# Patient Record
Sex: Female | Born: 1945
Health system: Southern US, Community
[De-identification: ages and names within clinical notes are randomized; demographics above are authoritative.]

## PROBLEM LIST (undated history)

## (undated) DIAGNOSIS — D72820 Lymphocytosis (symptomatic): Secondary | ICD-10-CM

## (undated) DIAGNOSIS — M6740 Ganglion, unspecified site: Secondary | ICD-10-CM

## (undated) DIAGNOSIS — C801 Malignant (primary) neoplasm, unspecified: Secondary | ICD-10-CM

## (undated) DIAGNOSIS — N390 Urinary tract infection, site not specified: Secondary | ICD-10-CM

## (undated) HISTORY — DX: Urinary tract infection, site not specified: N39.0

## (undated) HISTORY — PX: NO PAST SURGERIES: SHX2092

## (undated) HISTORY — DX: Lymphocytosis (symptomatic): D72.820

---

## 2011-11-06 DIAGNOSIS — M2240 Chondromalacia patellae, unspecified knee: Secondary | ICD-10-CM | POA: Diagnosis not present

## 2012-01-26 DIAGNOSIS — Z124 Encounter for screening for malignant neoplasm of cervix: Secondary | ICD-10-CM | POA: Diagnosis not present

## 2012-01-26 DIAGNOSIS — Z9189 Other specified personal risk factors, not elsewhere classified: Secondary | ICD-10-CM | POA: Diagnosis not present

## 2012-01-26 DIAGNOSIS — R87619 Unspecified abnormal cytological findings in specimens from cervix uteri: Secondary | ICD-10-CM | POA: Diagnosis not present

## 2012-01-26 DIAGNOSIS — Z1231 Encounter for screening mammogram for malignant neoplasm of breast: Secondary | ICD-10-CM | POA: Diagnosis not present

## 2012-02-09 DIAGNOSIS — H2510 Age-related nuclear cataract, unspecified eye: Secondary | ICD-10-CM | POA: Diagnosis not present

## 2012-04-21 DIAGNOSIS — L570 Actinic keratosis: Secondary | ICD-10-CM | POA: Diagnosis not present

## 2012-04-21 DIAGNOSIS — L819 Disorder of pigmentation, unspecified: Secondary | ICD-10-CM | POA: Diagnosis not present

## 2012-04-21 DIAGNOSIS — L219 Seborrheic dermatitis, unspecified: Secondary | ICD-10-CM | POA: Diagnosis not present

## 2012-04-21 DIAGNOSIS — L820 Inflamed seborrheic keratosis: Secondary | ICD-10-CM | POA: Diagnosis not present

## 2012-04-28 DIAGNOSIS — M159 Polyosteoarthritis, unspecified: Secondary | ICD-10-CM | POA: Diagnosis not present

## 2012-04-28 DIAGNOSIS — M6740 Ganglion, unspecified site: Secondary | ICD-10-CM | POA: Diagnosis not present

## 2013-01-31 DIAGNOSIS — Z1212 Encounter for screening for malignant neoplasm of rectum: Secondary | ICD-10-CM | POA: Diagnosis not present

## 2013-01-31 DIAGNOSIS — E049 Nontoxic goiter, unspecified: Secondary | ICD-10-CM | POA: Diagnosis not present

## 2013-01-31 DIAGNOSIS — Z124 Encounter for screening for malignant neoplasm of cervix: Secondary | ICD-10-CM | POA: Diagnosis not present

## 2013-01-31 DIAGNOSIS — Z1231 Encounter for screening mammogram for malignant neoplasm of breast: Secondary | ICD-10-CM | POA: Diagnosis not present

## 2013-02-06 DIAGNOSIS — H43399 Other vitreous opacities, unspecified eye: Secondary | ICD-10-CM | POA: Diagnosis not present

## 2013-02-07 DIAGNOSIS — H43819 Vitreous degeneration, unspecified eye: Secondary | ICD-10-CM | POA: Diagnosis not present

## 2013-03-04 DIAGNOSIS — H43819 Vitreous degeneration, unspecified eye: Secondary | ICD-10-CM | POA: Diagnosis not present

## 2013-03-23 DIAGNOSIS — R350 Frequency of micturition: Secondary | ICD-10-CM | POA: Diagnosis not present

## 2013-03-23 DIAGNOSIS — N390 Urinary tract infection, site not specified: Secondary | ICD-10-CM | POA: Diagnosis not present

## 2013-04-21 DIAGNOSIS — N390 Urinary tract infection, site not specified: Secondary | ICD-10-CM | POA: Diagnosis not present

## 2013-04-28 DIAGNOSIS — N390 Urinary tract infection, site not specified: Secondary | ICD-10-CM | POA: Diagnosis not present

## 2013-04-28 DIAGNOSIS — R350 Frequency of micturition: Secondary | ICD-10-CM | POA: Diagnosis not present

## 2013-11-02 DIAGNOSIS — D239 Other benign neoplasm of skin, unspecified: Secondary | ICD-10-CM | POA: Diagnosis not present

## 2013-11-02 DIAGNOSIS — D2370 Other benign neoplasm of skin of unspecified lower limb, including hip: Secondary | ICD-10-CM | POA: Diagnosis not present

## 2013-11-02 DIAGNOSIS — D1801 Hemangioma of skin and subcutaneous tissue: Secondary | ICD-10-CM | POA: Diagnosis not present

## 2013-11-02 DIAGNOSIS — L819 Disorder of pigmentation, unspecified: Secondary | ICD-10-CM | POA: Diagnosis not present

## 2013-11-02 DIAGNOSIS — L821 Other seborrheic keratosis: Secondary | ICD-10-CM | POA: Diagnosis not present

## 2013-11-25 DIAGNOSIS — K5730 Diverticulosis of large intestine without perforation or abscess without bleeding: Secondary | ICD-10-CM | POA: Diagnosis not present

## 2013-11-25 DIAGNOSIS — Z1211 Encounter for screening for malignant neoplasm of colon: Secondary | ICD-10-CM | POA: Diagnosis not present

## 2013-11-25 DIAGNOSIS — D126 Benign neoplasm of colon, unspecified: Secondary | ICD-10-CM | POA: Diagnosis not present

## 2013-12-28 DIAGNOSIS — N390 Urinary tract infection, site not specified: Secondary | ICD-10-CM | POA: Diagnosis not present

## 2013-12-28 DIAGNOSIS — R350 Frequency of micturition: Secondary | ICD-10-CM | POA: Diagnosis not present

## 2014-01-03 DIAGNOSIS — H43819 Vitreous degeneration, unspecified eye: Secondary | ICD-10-CM | POA: Diagnosis not present

## 2014-01-03 DIAGNOSIS — H2589 Other age-related cataract: Secondary | ICD-10-CM | POA: Diagnosis not present

## 2014-02-01 DIAGNOSIS — Z124 Encounter for screening for malignant neoplasm of cervix: Secondary | ICD-10-CM | POA: Diagnosis not present

## 2014-02-01 DIAGNOSIS — Z1331 Encounter for screening for depression: Secondary | ICD-10-CM | POA: Diagnosis not present

## 2014-02-01 DIAGNOSIS — R87610 Atypical squamous cells of undetermined significance on cytologic smear of cervix (ASC-US): Secondary | ICD-10-CM | POA: Diagnosis not present

## 2014-03-21 ENCOUNTER — Other Ambulatory Visit: Payer: Self-pay | Admitting: Internal Medicine

## 2014-03-21 ENCOUNTER — Ambulatory Visit
Admission: RE | Admit: 2014-03-21 | Discharge: 2014-03-21 | Disposition: A | Discharge status: Home / Self Care | Payer: Medicare Other | Source: Ambulatory Visit | Attending: Internal Medicine | Admitting: Internal Medicine

## 2014-03-21 DIAGNOSIS — R059 Cough, unspecified: Secondary | ICD-10-CM

## 2014-03-21 DIAGNOSIS — R05 Cough: Secondary | ICD-10-CM

## 2014-04-11 DIAGNOSIS — H43813 Vitreous degeneration, bilateral: Secondary | ICD-10-CM | POA: Diagnosis not present

## 2015-01-04 DIAGNOSIS — D2239 Melanocytic nevi of other parts of face: Secondary | ICD-10-CM | POA: Diagnosis not present

## 2015-01-04 DIAGNOSIS — L819 Disorder of pigmentation, unspecified: Secondary | ICD-10-CM | POA: Diagnosis not present

## 2015-01-04 DIAGNOSIS — D225 Melanocytic nevi of trunk: Secondary | ICD-10-CM | POA: Diagnosis not present

## 2015-01-04 DIAGNOSIS — D2271 Melanocytic nevi of right lower limb, including hip: Secondary | ICD-10-CM | POA: Diagnosis not present

## 2015-01-04 DIAGNOSIS — L570 Actinic keratosis: Secondary | ICD-10-CM | POA: Diagnosis not present

## 2015-01-04 DIAGNOSIS — L814 Other melanin hyperpigmentation: Secondary | ICD-10-CM | POA: Diagnosis not present

## 2015-01-04 DIAGNOSIS — L821 Other seborrheic keratosis: Secondary | ICD-10-CM | POA: Diagnosis not present

## 2015-01-04 DIAGNOSIS — D1801 Hemangioma of skin and subcutaneous tissue: Secondary | ICD-10-CM | POA: Diagnosis not present

## 2015-02-27 DIAGNOSIS — Z01419 Encounter for gynecological examination (general) (routine) without abnormal findings: Secondary | ICD-10-CM | POA: Diagnosis not present

## 2015-02-27 DIAGNOSIS — Z0000 Encounter for general adult medical examination without abnormal findings: Secondary | ICD-10-CM | POA: Diagnosis not present

## 2015-02-27 DIAGNOSIS — Z13220 Encounter for screening for lipoid disorders: Secondary | ICD-10-CM | POA: Diagnosis not present

## 2015-02-27 DIAGNOSIS — R319 Hematuria, unspecified: Secondary | ICD-10-CM | POA: Diagnosis not present

## 2015-02-27 DIAGNOSIS — Z130 Encounter for screening for diseases of the blood and blood-forming organs and certain disorders involving the immune mechanism: Secondary | ICD-10-CM | POA: Diagnosis not present

## 2015-02-27 DIAGNOSIS — Z1231 Encounter for screening mammogram for malignant neoplasm of breast: Secondary | ICD-10-CM | POA: Diagnosis not present

## 2015-02-27 DIAGNOSIS — Z6820 Body mass index (BMI) 20.0-20.9, adult: Secondary | ICD-10-CM | POA: Diagnosis not present

## 2015-02-27 DIAGNOSIS — Z124 Encounter for screening for malignant neoplasm of cervix: Secondary | ICD-10-CM | POA: Diagnosis not present

## 2015-02-27 DIAGNOSIS — Z13228 Encounter for screening for other metabolic disorders: Secondary | ICD-10-CM | POA: Diagnosis not present

## 2015-02-27 DIAGNOSIS — Z1329 Encounter for screening for other suspected endocrine disorder: Secondary | ICD-10-CM | POA: Diagnosis not present

## 2015-03-08 ENCOUNTER — Telehealth: Payer: Self-pay | Admitting: Oncology

## 2015-03-08 NOTE — Telephone Encounter (Signed)
New patient appt-s/w patient and gave np appt for 09/29 @ 10:30 w/Dr. Alen Blew.  Referring Dr. Paula Compton Dx- Disorder of WBC   Referral information scanned

## 2015-03-14 ENCOUNTER — Encounter: Payer: Self-pay | Admitting: *Deleted

## 2015-03-15 ENCOUNTER — Encounter: Payer: Self-pay | Admitting: Oncology

## 2015-03-15 ENCOUNTER — Other Ambulatory Visit (HOSPITAL_COMMUNITY)
Admission: RE | Admit: 2015-03-15 | Discharge: 2015-03-15 | Disposition: A | Discharge status: Home / Self Care | Payer: Medicare Other | Source: Ambulatory Visit | Attending: Oncology | Admitting: Oncology

## 2015-03-15 ENCOUNTER — Ambulatory Visit (HOSPITAL_BASED_OUTPATIENT_CLINIC_OR_DEPARTMENT_OTHER): Payer: Medicare Other

## 2015-03-15 ENCOUNTER — Telehealth: Payer: Self-pay | Admitting: Oncology

## 2015-03-15 ENCOUNTER — Ambulatory Visit (HOSPITAL_BASED_OUTPATIENT_CLINIC_OR_DEPARTMENT_OTHER): Payer: Medicare Other | Admitting: Oncology

## 2015-03-15 VITALS — BP 169/81 | HR 78 | Temp 98.1°F | Resp 18 | Ht 61.0 in | Wt 109.4 lb

## 2015-03-15 DIAGNOSIS — D72820 Lymphocytosis (symptomatic): Secondary | ICD-10-CM

## 2015-03-15 DIAGNOSIS — Z87440 Personal history of urinary (tract) infections: Secondary | ICD-10-CM

## 2015-03-15 DIAGNOSIS — D7289 Other specified disorders of white blood cells: Secondary | ICD-10-CM | POA: Diagnosis not present

## 2015-03-15 LAB — CBC WITH DIFFERENTIAL/PLATELET
BASO%: 0.2 % (ref 0.0–2.0)
Basophils Absolute: 0 10*3/uL (ref 0.0–0.1)
EOS%: 0.1 % (ref 0.0–7.0)
Eosinophils Absolute: 0 10*3/uL (ref 0.0–0.5)
HEMATOCRIT: 43.3 % (ref 34.8–46.6)
HEMOGLOBIN: 14.3 g/dL (ref 11.6–15.9)
LYMPH#: 16 10*3/uL — AB (ref 0.9–3.3)
LYMPH%: 82.1 % — ABNORMAL HIGH (ref 14.0–49.7)
MCH: 30 pg (ref 25.1–34.0)
MCHC: 33 g/dL (ref 31.5–36.0)
MCV: 90.9 fL (ref 79.5–101.0)
MONO#: 0.5 10*3/uL (ref 0.1–0.9)
MONO%: 2.4 % (ref 0.0–14.0)
NEUT%: 15.2 % — ABNORMAL LOW (ref 38.4–76.8)
NEUTROS ABS: 3 10*3/uL (ref 1.5–6.5)
Platelets: 282 10*3/uL (ref 145–400)
RBC: 4.77 10*6/uL (ref 3.70–5.45)
RDW: 13 % (ref 11.2–14.5)
WBC: 19.5 10*3/uL — AB (ref 3.9–10.3)

## 2015-03-15 LAB — TECHNOLOGIST REVIEW

## 2015-03-15 NOTE — Progress Notes (Signed)
Please see consult note.  

## 2015-03-15 NOTE — Consult Note (Signed)
Reason for Referral:  Lymphocytosis.  HPI:  69 year old woman currently of [place] for the last 2 years. She have lived in multiple locations and originally from [place]. Most recently she establish care with gynecology and had a CBC routinely done and noted that to have increase in her white cell count up to 22,000. She had lymphocytosis with 81% lymphocytes but no other hematological abnormalities. Her hemoglobin is normal and platelet counts are normal. Her white cell count differential was otherwise normal. Except for recurrent urinary tract infections for the last 2 years she has had no other symptoms. She does not recall ever been told that her white cell count being elevated. She denied any fevers or chills or lymphadenopathy. Has not reported any petechiae. Has not reported any other infections hospitalizations. She is a very active and healthy woman and have not had any decline in her health.   She does not report any headaches, blurry vision, syncope or seizures. She does not report any chills, sweats, weight loss or any other constitutional symptoms. She does not report any chest pain, palpitation, orthopnea or leg edema. She does not report any shortness of breath, hemoptysis or wheezing. She does not report any nausea, vomiting, abdominal pain, early satiety. She does not report any frequency, urgency or hematuria. She does report occasional dysuria. She does not report any skeletal complaints. Remaining review of systems unremarkable.    Past Medical History  Diagnosis Date  . UTI (lower urinary tract infection)   . Lymphocytosis   :  No past surgical history on file.:  No current outpatient prescriptions on file.:  No Known Allergies:  No family history on file.:  Social History   Social History  . Marital Status: Married    Spouse Name: N/A  . Number of Children: N/A  . Years of Education: N/A   Occupational History  . Not on file.   Social History Main  Topics  . Smoking status: Not on file  . Smokeless tobacco: Not on file  . Alcohol Use: Not on file  . Drug Use: Not on file  . Sexual Activity: Not on file   Other Topics Concern  . Not on file   Social History Narrative  . No narrative on file  :  Pertinent items are noted in HPI.  Exam: ECOG 0 Blood pressure 169/81, pulse 78, temperature 98.1 F (36.7 C), temperature source Oral, resp. rate 18, height 5\' 1"  (1.549 m), weight 109 lb 6.4 oz (49.624 kg), SpO2 100 %. General appearance: alert and cooperative healthy-appearing woman without distress. Head: Normocephalic, without obvious abnormality Throat: lips, mucosa, and tongue normal; teeth and gums normal no oral ulcers or lesions. Neck: no adenopathy Back: negative Resp: clear to auscultation bilaterally Chest wall: no tenderness Cardio: regular rate and rhythm, S1, S2 normal, no murmur, click, rub or gallop GI: soft, non-tender; bowel sounds normal; no masses,  no organomegaly could not appreciate any splenomegaly. Extremities: extremities normal, atraumatic, no cyanosis or edema Pulses: 2+ and symmetric Skin: Skin color, texture, turgor normal. No rashes or lesions Lymph nodes: Cervical, supraclavicular, and axillary nodes normal.   Assessment and Plan:     69 year old woman with the following issues:  1. Lymphocytosis: The differential diagnosis was discussed with the patient and her husband. Lymphoproliferative disorder as high on the list and the most common condition is chronic lymphocytic leukemia (CLL ). Other lymphoproliferative disorders such as mantle cell lymphoma, hairy cell leukemia , and other lymphomas are possibilities. Less  likely. Reactive findings are also a possibility at this time.   To investigate this further I will repeat her CBC today and obtain peripheral blood flow cytometry for confirmation purposes. If we are dealing with CLL which is the most likely possibility , no indication for treatment  is needed at this time.   The natural course of this disease was discussed with the patient today. Most likely we are dealing with an indolent nature of this disease and no indication for treatment  is noted. Indication for treatment include symptomatic lymphadenopathy, cytopenias constitutional symptoms. He has not of these at this time and will likely not need any treatment in the immediate future.   2. Recurrent urinary tract infections: I do not think this is related to her lymphoproliferative disorder but it certainly possible. I am obtaining quantitative immunoglobulins to check her immune function. She might need a urology evaluation of this becomes and continues to be an issue.   3. Follow-up: Will be in 3 months for surveillance purposes sooner and for flow cytometry show other abnormalities.

## 2015-03-15 NOTE — Telephone Encounter (Signed)
per pof to sch pt appt-gave pt copy of avs °

## 2015-03-19 LAB — SPEP & IFE WITH QIG
ALBUMIN ELP: 4.4 g/dL (ref 3.8–4.8)
ALPHA-2-GLOBULIN: 0.7 g/dL (ref 0.5–0.9)
Alpha-1-Globulin: 0.3 g/dL (ref 0.2–0.3)
BETA GLOBULIN: 0.3 g/dL — AB (ref 0.4–0.6)
Beta 2: 0.3 g/dL (ref 0.2–0.5)
Gamma Globulin: 0.8 g/dL (ref 0.8–1.7)
IgA: 171 mg/dL (ref 69–380)
IgG (Immunoglobin G), Serum: 896 mg/dL (ref 690–1700)
IgM, Serum: 122 mg/dL (ref 52–322)
Total Protein, Serum Electrophoresis: 6.7 g/dL (ref 6.1–8.1)

## 2015-03-20 LAB — FLOW CYTOMETRY

## 2015-03-23 DIAGNOSIS — C9110 Chronic lymphocytic leukemia of B-cell type not having achieved remission: Secondary | ICD-10-CM | POA: Diagnosis not present

## 2015-03-23 DIAGNOSIS — Z0000 Encounter for general adult medical examination without abnormal findings: Secondary | ICD-10-CM | POA: Diagnosis not present

## 2015-03-23 DIAGNOSIS — Z23 Encounter for immunization: Secondary | ICD-10-CM | POA: Diagnosis not present

## 2015-03-23 DIAGNOSIS — Z1389 Encounter for screening for other disorder: Secondary | ICD-10-CM | POA: Diagnosis not present

## 2015-03-23 DIAGNOSIS — M8580 Other specified disorders of bone density and structure, unspecified site: Secondary | ICD-10-CM | POA: Diagnosis not present

## 2015-04-09 ENCOUNTER — Other Ambulatory Visit: Payer: Self-pay | Admitting: Oncology

## 2015-04-09 DIAGNOSIS — C9110 Chronic lymphocytic leukemia of B-cell type not having achieved remission: Secondary | ICD-10-CM

## 2015-04-10 ENCOUNTER — Other Ambulatory Visit: Payer: Self-pay | Admitting: Oncology

## 2015-04-17 ENCOUNTER — Other Ambulatory Visit: Payer: Self-pay | Admitting: Oncology

## 2015-04-23 DIAGNOSIS — M810 Age-related osteoporosis without current pathological fracture: Secondary | ICD-10-CM | POA: Diagnosis not present

## 2015-04-23 DIAGNOSIS — M859 Disorder of bone density and structure, unspecified: Secondary | ICD-10-CM | POA: Diagnosis not present

## 2015-05-16 DIAGNOSIS — M810 Age-related osteoporosis without current pathological fracture: Secondary | ICD-10-CM | POA: Diagnosis not present

## 2015-06-14 ENCOUNTER — Ambulatory Visit (HOSPITAL_BASED_OUTPATIENT_CLINIC_OR_DEPARTMENT_OTHER): Payer: Medicare Other | Admitting: Oncology

## 2015-06-14 ENCOUNTER — Telehealth: Payer: Self-pay | Admitting: Oncology

## 2015-06-14 ENCOUNTER — Other Ambulatory Visit (HOSPITAL_BASED_OUTPATIENT_CLINIC_OR_DEPARTMENT_OTHER): Payer: Medicare Other

## 2015-06-14 VITALS — BP 149/66 | HR 76 | Temp 98.1°F | Resp 18 | Ht 61.0 in | Wt 111.2 lb

## 2015-06-14 DIAGNOSIS — C9110 Chronic lymphocytic leukemia of B-cell type not having achieved remission: Secondary | ICD-10-CM

## 2015-06-14 DIAGNOSIS — D72820 Lymphocytosis (symptomatic): Secondary | ICD-10-CM

## 2015-06-14 LAB — CBC WITH DIFFERENTIAL/PLATELET
BASO%: 0.1 % (ref 0.0–2.0)
BASOS ABS: 0 10*3/uL (ref 0.0–0.1)
EOS ABS: 0 10*3/uL (ref 0.0–0.5)
EOS%: 0.1 % (ref 0.0–7.0)
HCT: 43.4 % (ref 34.8–46.6)
HEMOGLOBIN: 14.2 g/dL (ref 11.6–15.9)
LYMPH%: 77.2 % — AB (ref 14.0–49.7)
MCH: 30.7 pg (ref 25.1–34.0)
MCHC: 32.7 g/dL (ref 31.5–36.0)
MCV: 93.9 fL (ref 79.5–101.0)
MONO#: 0.6 10*3/uL (ref 0.1–0.9)
MONO%: 2.9 % (ref 0.0–14.0)
NEUT%: 19.7 % — ABNORMAL LOW (ref 38.4–76.8)
NEUTROS ABS: 3.8 10*3/uL (ref 1.5–6.5)
PLATELETS: 246 10*3/uL (ref 145–400)
RBC: 4.62 10*6/uL (ref 3.70–5.45)
RDW: 12.9 % (ref 11.2–14.5)
WBC: 19.1 10*3/uL — ABNORMAL HIGH (ref 3.9–10.3)
lymph#: 14.8 10*3/uL — ABNORMAL HIGH (ref 0.9–3.3)

## 2015-06-14 LAB — COMPREHENSIVE METABOLIC PANEL
ALBUMIN: 3.9 g/dL (ref 3.5–5.0)
ALK PHOS: 87 U/L (ref 40–150)
ALT: 16 U/L (ref 0–55)
AST: 23 U/L (ref 5–34)
Anion Gap: 8 mEq/L (ref 3–11)
BUN: 18.2 mg/dL (ref 7.0–26.0)
CALCIUM: 10.6 mg/dL — AB (ref 8.4–10.4)
CO2: 26 mEq/L (ref 22–29)
CREATININE: 0.8 mg/dL (ref 0.6–1.1)
Chloride: 107 mEq/L (ref 98–109)
EGFR: 72 mL/min/{1.73_m2} — ABNORMAL LOW (ref >90)
Glucose: 137 mg/dl (ref 70–140)
Potassium: 5.5 mEq/L — ABNORMAL HIGH (ref 3.5–5.1)
Sodium: 141 mEq/L (ref 136–145)
Total Bilirubin: 1.06 mg/dL (ref 0.20–1.20)
Total Protein: 6.9 g/dL (ref 6.4–8.3)

## 2015-06-14 NOTE — Progress Notes (Signed)
Hematology and Oncology Follow Up Visit  Wendy Jim MD:[id number] 1945/10/29 69 y.o. 06/14/2015 9:01 AM Wendy Hamilton, MDNo ref. provider found   Principle Diagnosis: 69 year old woman with chronic lymphocytic leukemia (CLL) diagnosed in September 2016. She presented with lymphocytosis and no other findings indicating stage 0 disease. Her disease was confirmed by peripheral blood flow cytometry.   Current therapy: Observation and surveillance.  Interim History: Wendy Hamilton presents today for a follow-up visit with her husband. Since the last visit, she reports no new complaints. She continues to function normally and performs activities of daily living without any decline. She denied any fevers, weight loss or constitutional symptoms. Has not reported any recent infections or hospitalizations. She did provide me with older white cell count dating back 2010 with elevated white cell count of 12,000. She denied any palpable lymphadenopathy or petechiae. She is completely asymptomatic.  She does not report any headaches, blurry vision, syncope or seizures. She does not report any chills, sweats, weight loss or any other constitutional symptoms. She does not report any chest pain, palpitation, orthopnea or leg edema. She does not report any shortness of breath, hemoptysis or wheezing. She does not report any nausea, vomiting, abdominal pain, early satiety. She does not report any frequency, urgency or hematuria. She does report occasional dysuria. She does not report any skeletal complaints. Remaining review of systems unremarkable.  Medications: I have reviewed the patient's current medications. She only takes vitamin D supplements.   Allergies: No Known Allergies  Past Medical History, Surgical history, Social history, and Family History were reviewed and updated.   Physical Exam: Blood pressure 149/66, pulse 76, temperature 98.1 F (36.7 C), temperature source Oral, resp. rate 18, height 5\' 1"   (1.549 m), weight 111 lb 3.2 oz (50.44 kg), SpO2 100 %. ECOG: 0 General appearance: alert and cooperative appeared without distress. Head: Normocephalic, without obvious abnormality no oral ulcers or lesions. Neck: no adenopathy Lymph nodes: Cervical, supraclavicular, and axillary nodes normal. Heart:regular rate and rhythm, S1, S2 normal, no murmur, click, rub or gallop Lung:chest clear, no wheezing, rales, normal symmetric air entry Abdomin: soft, non-tender, without masses or organomegaly shifting dullness or ascites. EXT:no erythema, induration, or nodules   Lab Results: Lab Results  Component Value Date   WBC 19.1* 06/14/2015   HGB 14.2 06/14/2015   HCT 43.4 06/14/2015   MCV 93.9 06/14/2015   PLT 246 06/14/2015     Chemistry   No results found for: NA, K, CL, CO2, BUN, CREATININE, GLU No results found for: CALCIUM, ALKPHOS, AST, ALT, BILITOT     Impression and Plan:   69 year old woman with the following issues:  1. CLL diagnosed in September 2016 after presenting with lymphocytosis and appears to have stage 0 disease. Her white cell count today is no different than it was in September 2016 and minimally changed since 2010. This indicates a rather indolent process and does not require any treatment at this time. I recommended observation and surveillance and intervening only if she develops symptomatic disease.  I educated her about indications for treatment for CLL. These would include rapidly progressing white cell count, symptomatic lymphadenopathy, cytopenias, recurrent infections or autoimmune hemolysis. She clearly has not of these things and we'll continue to monitor her periodically at this time.  2. Age-appropriate cancer screening: I recommended continued to do so and received vaccinations appropriately.  3. Follow-up: will be in 6 months.   [facility], MD 12/29/20169:01 AM

## 2015-06-14 NOTE — Telephone Encounter (Signed)
Gave patient avs report and appointments for June.  °

## 2015-10-03 ENCOUNTER — Other Ambulatory Visit: Payer: Self-pay | Admitting: Orthopedic Surgery

## 2015-10-03 ENCOUNTER — Encounter (HOSPITAL_BASED_OUTPATIENT_CLINIC_OR_DEPARTMENT_OTHER): Payer: Self-pay | Admitting: *Deleted

## 2015-10-03 DIAGNOSIS — D2111 Benign neoplasm of connective and other soft tissue of right upper limb, including shoulder: Secondary | ICD-10-CM | POA: Diagnosis not present

## 2015-10-03 DIAGNOSIS — M19041 Primary osteoarthritis, right hand: Secondary | ICD-10-CM | POA: Diagnosis not present

## 2015-10-03 DIAGNOSIS — M67441 Ganglion, right hand: Secondary | ICD-10-CM | POA: Diagnosis not present

## 2015-10-09 ENCOUNTER — Encounter (HOSPITAL_BASED_OUTPATIENT_CLINIC_OR_DEPARTMENT_OTHER)
Admission: RE | Disposition: A | Discharge status: Home / Self Care | Payer: Self-pay | Source: Ambulatory Visit | Attending: Orthopedic Surgery

## 2015-10-09 ENCOUNTER — Ambulatory Visit (HOSPITAL_BASED_OUTPATIENT_CLINIC_OR_DEPARTMENT_OTHER): Payer: Medicare Other | Admitting: Anesthesiology

## 2015-10-09 ENCOUNTER — Encounter (HOSPITAL_BASED_OUTPATIENT_CLINIC_OR_DEPARTMENT_OTHER): Payer: Self-pay | Admitting: Orthopedic Surgery

## 2015-10-09 ENCOUNTER — Ambulatory Visit (HOSPITAL_BASED_OUTPATIENT_CLINIC_OR_DEPARTMENT_OTHER)
Admission: RE | Admit: 2015-10-09 | Discharge: 2015-10-09 | Disposition: A | Discharge status: Home / Self Care | Payer: Medicare Other | Source: Ambulatory Visit | Attending: Orthopedic Surgery | Admitting: Orthopedic Surgery

## 2015-10-09 DIAGNOSIS — D2111 Benign neoplasm of connective and other soft tissue of right upper limb, including shoulder: Secondary | ICD-10-CM | POA: Diagnosis not present

## 2015-10-09 DIAGNOSIS — M19041 Primary osteoarthritis, right hand: Secondary | ICD-10-CM | POA: Diagnosis not present

## 2015-10-09 DIAGNOSIS — M71341 Other bursal cyst, right hand: Secondary | ICD-10-CM | POA: Diagnosis not present

## 2015-10-09 DIAGNOSIS — M25841 Other specified joint disorders, right hand: Secondary | ICD-10-CM | POA: Diagnosis not present

## 2015-10-09 DIAGNOSIS — M152 Bouchard's nodes (with arthropathy): Secondary | ICD-10-CM | POA: Diagnosis not present

## 2015-10-09 HISTORY — DX: Ganglion, unspecified site: M67.40

## 2015-10-09 HISTORY — DX: Malignant (primary) neoplasm, unspecified: C80.1

## 2015-10-09 HISTORY — PX: MASS EXCISION: SHX2000

## 2015-10-09 SURGERY — EXCISION MASS
Anesthesia: Monitor Anesthesia Care | Site: Finger | Laterality: Right

## 2015-10-09 MED ORDER — ONDANSETRON HCL 4 MG/2ML IJ SOLN
INTRAMUSCULAR | Status: DC | PRN
  Administered 2015-10-09: 4 mg via INTRAVENOUS

## 2015-10-09 MED ORDER — FENTANYL CITRATE (PF) 100 MCG/2ML IJ SOLN: 25.0000 ug | INTRAMUSCULAR | Status: DC | PRN

## 2015-10-09 MED ORDER — CHLORHEXIDINE GLUCONATE 4 % EX LIQD: 60.0000 mL | Freq: Once | CUTANEOUS | Status: DC

## 2015-10-09 MED ORDER — ONDANSETRON HCL 4 MG/2ML IJ SOLN: 4.0000 mg | Freq: Once | INTRAMUSCULAR | Status: DC | PRN

## 2015-10-09 MED ORDER — MIDAZOLAM HCL 2 MG/2ML IJ SOLN
INTRAMUSCULAR | Status: AC
  Filled 2015-10-09: qty 2

## 2015-10-09 MED ORDER — SUCCINYLCHOLINE CHLORIDE 20 MG/ML IJ SOLN
INTRAMUSCULAR | Status: AC
  Filled 2015-10-09: qty 1

## 2015-10-09 MED ORDER — FENTANYL CITRATE (PF) 100 MCG/2ML IJ SOLN
50.0000 ug | INTRAMUSCULAR | Status: AC | PRN
  Administered 2015-10-09: 100 ug via INTRAVENOUS
  Administered 2015-10-09 (×2): 50 ug via INTRAVENOUS

## 2015-10-09 MED ORDER — BUPIVACAINE HCL (PF) 0.25 % IJ SOLN
INTRAMUSCULAR | Status: DC | PRN
  Administered 2015-10-09: 8 mL

## 2015-10-09 MED ORDER — HYDROCODONE-ACETAMINOPHEN 5-325 MG PO TABS: 1.0000 | ORAL_TABLET | Freq: Four times a day (QID) | ORAL | Status: DC | PRN

## 2015-10-09 MED ORDER — CEFAZOLIN SODIUM-DEXTROSE 2-4 GM/100ML-% IV SOLN
2.0000 g | INTRAVENOUS | Status: AC
  Administered 2015-10-09: 2 g via INTRAVENOUS

## 2015-10-09 MED ORDER — PROPOFOL 500 MG/50ML IV EMUL
INTRAVENOUS | Status: AC
  Filled 2015-10-09: qty 50

## 2015-10-09 MED ORDER — LIDOCAINE HCL (CARDIAC) 20 MG/ML IV SOLN
INTRAVENOUS | Status: DC | PRN
  Administered 2015-10-09: 25 mg via INTRAVENOUS

## 2015-10-09 MED ORDER — GLYCOPYRROLATE 0.2 MG/ML IJ SOLN: 0.2000 mg | Freq: Once | INTRAMUSCULAR | Status: DC | PRN

## 2015-10-09 MED ORDER — MIDAZOLAM HCL 2 MG/2ML IJ SOLN
1.0000 mg | INTRAMUSCULAR | Status: DC | PRN
  Administered 2015-10-09: 2 mg via INTRAVENOUS

## 2015-10-09 MED ORDER — PROPOFOL 10 MG/ML IV BOLUS
INTRAVENOUS | Status: DC | PRN
  Administered 2015-10-09 (×2): 10 mg via INTRAVENOUS

## 2015-10-09 MED ORDER — LIDOCAINE HCL (CARDIAC) 20 MG/ML IV SOLN
INTRAVENOUS | Status: AC
  Filled 2015-10-09: qty 5

## 2015-10-09 MED ORDER — ATROPINE SULFATE 0.4 MG/ML IJ SOLN
INTRAMUSCULAR | Status: AC
  Filled 2015-10-09: qty 1

## 2015-10-09 MED ORDER — ONDANSETRON HCL 4 MG/2ML IJ SOLN
INTRAMUSCULAR | Status: AC
  Filled 2015-10-09: qty 2

## 2015-10-09 MED ORDER — PHENYLEPHRINE 40 MCG/ML (10ML) SYRINGE FOR IV PUSH (FOR BLOOD PRESSURE SUPPORT)
PREFILLED_SYRINGE | INTRAVENOUS | Status: AC
  Filled 2015-10-09: qty 10

## 2015-10-09 MED ORDER — CEFAZOLIN SODIUM-DEXTROSE 2-4 GM/100ML-% IV SOLN
INTRAVENOUS | Status: AC
  Filled 2015-10-09: qty 100

## 2015-10-09 MED ORDER — SCOPOLAMINE 1 MG/3DAYS TD PT72: 1.0000 | MEDICATED_PATCH | Freq: Once | TRANSDERMAL | Status: DC | PRN

## 2015-10-09 MED ORDER — FENTANYL CITRATE (PF) 100 MCG/2ML IJ SOLN
INTRAMUSCULAR | Status: AC
  Filled 2015-10-09: qty 2

## 2015-10-09 MED ORDER — LACTATED RINGERS IV SOLN
INTRAVENOUS | Status: DC
  Administered 2015-10-09: 09:00:00 via INTRAVENOUS

## 2015-10-09 SURGICAL SUPPLY — 48 items
BANDAGE COBAN STERILE 2 (GAUZE/BANDAGES/DRESSINGS) IMPLANT
BLADE SURG 15 STRL LF DISP TIS (BLADE) ×1 IMPLANT
BLADE SURG 15 STRL SS (BLADE) ×2
BNDG COHESIVE 1X5 TAN STRL LF (GAUZE/BANDAGES/DRESSINGS) ×3 IMPLANT
BNDG COHESIVE 3X5 TAN STRL LF (GAUZE/BANDAGES/DRESSINGS) IMPLANT
BNDG ESMARK 4X9 LF (GAUZE/BANDAGES/DRESSINGS) ×3 IMPLANT
BNDG GAUZE ELAST 4 BULKY (GAUZE/BANDAGES/DRESSINGS) IMPLANT
CHLORAPREP W/TINT 26ML (MISCELLANEOUS) ×3 IMPLANT
CORDS BIPOLAR (ELECTRODE) ×3 IMPLANT
COVER BACK TABLE 60X90IN (DRAPES) ×3 IMPLANT
COVER MAYO STAND STRL (DRAPES) ×3 IMPLANT
CUFF TOURNIQUET SINGLE 18IN (TOURNIQUET CUFF) ×3 IMPLANT
DECANTER SPIKE VIAL GLASS SM (MISCELLANEOUS) IMPLANT
DRAIN PENROSE 1/2X12 LTX STRL (WOUND CARE) IMPLANT
DRAPE EXTREMITY T 121X128X90 (DRAPE) ×3 IMPLANT
DRAPE SURG 17X23 STRL (DRAPES) ×3 IMPLANT
GAUZE SPONGE 4X4 12PLY STRL (GAUZE/BANDAGES/DRESSINGS) ×3 IMPLANT
GAUZE XEROFORM 1X8 LF (GAUZE/BANDAGES/DRESSINGS) ×3 IMPLANT
GLOVE BIOGEL PI IND STRL 7.0 (GLOVE) ×2 IMPLANT
GLOVE BIOGEL PI IND STRL 8 (GLOVE) ×1 IMPLANT
GLOVE BIOGEL PI IND STRL 8.5 (GLOVE) ×1 IMPLANT
GLOVE BIOGEL PI INDICATOR 7.0 (GLOVE) ×4
GLOVE BIOGEL PI INDICATOR 8 (GLOVE) ×2
GLOVE BIOGEL PI INDICATOR 8.5 (GLOVE) ×2
GLOVE ECLIPSE 6.5 STRL STRAW (GLOVE) ×3 IMPLANT
GLOVE SURG ORTHO 8.0 STRL STRW (GLOVE) ×3 IMPLANT
GLOVE SURG SS PI 7.5 STRL IVOR (GLOVE) ×3 IMPLANT
GOWN STRL REUS W/ TWL LRG LVL3 (GOWN DISPOSABLE) ×2 IMPLANT
GOWN STRL REUS W/TWL LRG LVL3 (GOWN DISPOSABLE) ×4
GOWN STRL REUS W/TWL XL LVL3 (GOWN DISPOSABLE) ×3 IMPLANT
NDL SAFETY ECLIPSE 18X1.5 (NEEDLE) IMPLANT
NEEDLE HYPO 18GX1.5 SHARP (NEEDLE)
NEEDLE PRECISIONGLIDE 27X1.5 (NEEDLE) ×3 IMPLANT
NS IRRIG 1000ML POUR BTL (IV SOLUTION) ×3 IMPLANT
PACK BASIN DAY SURGERY FS (CUSTOM PROCEDURE TRAY) ×3 IMPLANT
PAD CAST 3X4 CTTN HI CHSV (CAST SUPPLIES) IMPLANT
PADDING CAST COTTON 3X4 STRL (CAST SUPPLIES)
SPLINT FINGER 3.25 911903 (SOFTGOODS) ×3 IMPLANT
SPLINT PLASTER CAST XFAST 3X15 (CAST SUPPLIES) IMPLANT
SPLINT PLASTER XTRA FASTSET 3X (CAST SUPPLIES)
STOCKINETTE 4X48 STRL (DRAPES) ×3 IMPLANT
SUT ETHILON 4 0 PS 2 18 (SUTURE) ×3 IMPLANT
SUT NOVAFIL 5 0 BLK 18 IN P13 (SUTURE) ×3 IMPLANT
SUT VIC AB 4-0 P2 18 (SUTURE) IMPLANT
SYR BULB 3OZ (MISCELLANEOUS) ×3 IMPLANT
SYR CONTROL 10ML LL (SYRINGE) ×3 IMPLANT
TOWEL OR 17X24 6PK STRL BLUE (TOWEL DISPOSABLE) ×3 IMPLANT
UNDERPAD 30X30 (UNDERPADS AND DIAPERS) IMPLANT

## 2015-10-09 NOTE — Transfer of Care (Signed)
Immediate Anesthesia Transfer of Care Note  Patient: Wendy Hamilton  Procedure(s) Performed: Procedure(s): EXCISION MUCOID CYST (Right)  Patient Location: PACU  Anesthesia Type:MAC  Level of Consciousness: awake and alert   Airway & Oxygen Therapy: Patient Spontanous Breathing and Patient connected to face mask oxygen  Post-op Assessment: Report given to RN and Post -op Vital signs reviewed and stable  Post vital signs: Reviewed and stable  Last Vitals:  Filed Vitals:   10/09/15 0928  BP: 161/73  Pulse: 66  Temp: 36.5 C  Resp: 16    Complications: No apparent anesthesia complications

## 2015-10-09 NOTE — Brief Op Note (Signed)
10/09/2015  10:17 AM  PATIENT:  Wendy Hamilton  70 y.o. 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, DEGENERATIVE JOINT DISEASE PROXIMAL INTERPHALANGEAL  POST-OPERATIVE DIAGNOSIS:  MUCOID TUMOR PROXIMAL INTERPHALANGEAL RIGHT MIDDLE FINGER, DEGENERATIVE JOINT DISEASE PROXIMAL INTERPHALANGEAL  PROCEDURE:  Procedure(s): EXCISION MUCOID CYST (Right)  SURGEON:  Surgeon(s) and Role:    * Daryll Brod, MD - Primary  PHYSICIAN ASSISTANT:   ASSISTANTS: none   ANESTHESIA:   local and regional  EBL:  Total I/O In: 500 [I.V.:500] Out: 5 [Blood:5]  BLOOD ADMINISTERED:none  DRAINS: none   LOCAL MEDICATIONS USED:  BUPIVICAINE   SPECIMEN:  Excision  DISPOSITION OF SPECIMEN:  PATHOLOGY  COUNTS:  YES  TOURNIQUET:   Total Tourniquet Time Documented: Forearm (Right) - 25 minutes Total: Forearm (Right) - 25 minutes   DICTATION: .Other Dictation: Dictation Number [id number]  PLAN OF CARE: Discharge to home after PACU  PATIENT DISPOSITION:  PACU - hemodynamically stable.

## 2015-10-09 NOTE — H&P (Signed)
  Wendy Hamilton is an 70 y.o. female.   Chief Complaint: mass right middle finger HPI: [name] is a 70 yo female with a mass on the proximal interphalangeal joint right middle finger. This has been present for 5 years.This has been lasered and biopsied without success. She complains of pain at the proximal interphalangeal joint of her right middle finger. She has no history of diabetes arthritis or gout.  Past Medical History  Diagnosis Date  . UTI (lower urinary tract infection)   . Lymphocytosis   . Cancer (HCC)     CLL unstaged-early  . Mucoid cyst of joint     right middle finger    Past Surgical History  Procedure Laterality Date  . No past surgeries      History reviewed. No pertinent family history. Social History:  reports that she has never smoked. She does not have any smokeless tobacco history on file. She reports that she drinks alcohol. She reports that she does not use illicit drugs.  Allergies: No Known Allergies  No prescriptions prior to admission    No results found for this or any previous visit (from the past 48 hour(s)).  No results found.   Pertinent items are noted in HPI.  Height 5' 1.25" (1.556 m), weight 50.803 kg (112 lb).  General appearance: alert, cooperative and appears stated age Head: Normocephalic, without obvious abnormality Neck: no JVD Resp: clear to auscultation bilaterally Cardio: regular rate and rhythm, S1, S2 normal, no murmur, click, rub or gallop GI: soft, non-tender; bowel sounds normal; no masses,  no organomegaly Extremities: mass right middle finger with DJD Pulses: 2+ and symmetric Skin: Skin color, texture, turgor normal. No rashes or lesions Neurologic: Grossly normal Incision/Wound: na  Assessment/Plan Dx: Cyst PIP rt middle finger with arthritis  Plan: excision cyst rt middle finger and debridement of PIP joint.Pre, peri and postoperative course were discussed along with the risks and complications.  The  patient is aware there is no guarantee with the surgery, possibility of infection, recurrence, injury to arteries, nerves, tendons, incomplete relief of symptoms and dystrophy.     Hearl Heikes R 10/09/2015, 8:19 AM

## 2015-10-09 NOTE — Op Note (Signed)
[id number] 

## 2015-10-09 NOTE — Anesthesia Procedure Notes (Signed)
Anesthesia Regional Block:  Bier block (IV Regional)  Pre-Anesthetic Checklist: ,, timeout performed, Correct Patient, Correct Site, Correct Laterality, Correct Procedure,, site marked, surgical consent,, at surgeon's request Needles:  Injection technique: Single-shot  Needle Type: Other      Needle Gauge: 20 and 20 G    Additional Needles: Bier block (IV Regional) Narrative:  Injection made incrementally with aspirations every 25 mL.  Performed by: Personally   Additional Notes: Per Melynda Ripple

## 2015-10-09 NOTE — Op Note (Signed)
NAMETashara, Wendy Hamilton                ACCOUNT NO.:  [id number]  MEDICAL RECORD NO.:  [id number]  LOCATION:                                 FACILITY:  PHYSICIAN:  Daryll Brod, M.D.            DATE OF BIRTH:  DATE OF PROCEDURE:  10/09/2015 DATE OF DISCHARGE:                              OPERATIVE REPORT   PREOPERATIVE DIAGNOSIS:  Mucoid tumor, proximal interphalangeal joint, right middle finger.  POSTOPERATIVE DIAGNOSIS:  Mucoid tumor, proximal interphalangeal joint, right middle finger.  OPERATION:  Excision of mucoid cyst, debridement of proximal interphalangeal joint, right middle finger.  SURGEON:  Daryll Brod, M.D.  ANESTHESIA:  Forearm IV regional with local infiltration.  HISTORY:  The patient is a 70 year old female with a history of a mucoid cyst on her right middle finger PIP joint.  This does transilluminate. She has elected to have this excised.  X-rays reveal significant degenerative changes to the PIP joint.  She is aware of risks and complications including infection, recurrence of injury to arteries, nerves, tendons, incomplete relief of symptoms and dystrophy and the possibility of stiffness.  In the preoperative area, the patient is seen, the extremity marked by both the patient and surgeon.  Antibiotic given.  PROCEDURE IN DETAIL:  The patient was brought to the operating room, where a forearm-based IV regional anesthetic was carried out without difficulty.  She was prepped using ChloraPrep, supine position with the right arm free.  A 3-minute dry time was allowed.  Time-out was taken confirming the patient and procedure.  A curvilinear incision was made over the proximal and middle phalanx of her right middle finger and carried down through subcutaneous tissue.  Bleeders were electrocauterized with bipolar.  A large cyst was present on the dorsal aspect through the extensor tendon of her middle finger PIP joint level. The area was opened.  The cyst was  followed down into the joint which was opened.  A very significant synovitis was present along with arthritic changes.  The cyst along with the synovial tissue from the joint was excised and sent to Pathology.  The osteophytes on the proximal phalanx were then removed with a hemostatic rongeur.  The wound was copiously irrigated with saline.  The area of incision through the extensor tendon was then repaired with a running 6-0 Novafil suture burying the knots.  The skin was then closed with interrupted 4-0 nylon sutures.  A metacarpal block was then given with 0.25% bupivacaine without epinephrine, 9 mL was used.    A sterile compressive dressing and splint to the PIP joint applied.  On deflation of the tourniquet, remaining fingers pinked.    She was taken to the recovery room for observation in satisfactory condition.  She will be discharged home to return to the [facility] in 1 week on Norco .          ______________________________ Daryll Brod, M.D.     GK/MEDQ  D:  10/09/2015  T:  10/09/2015  Job:  [id number]

## 2015-10-09 NOTE — Anesthesia Preprocedure Evaluation (Addendum)
Anesthesia Evaluation  Patient identified by MRN, date of birth, ID band Patient awake    Reviewed: Allergy & Precautions, H&P , NPO status , Patient's Chart, lab work & pertinent test results  History of Anesthesia Complications Negative for: history of anesthetic complications  Airway Mallampati: I  TM Distance: >3 FB Neck ROM: full    Dental no notable dental hx.    Pulmonary neg pulmonary ROS,    Pulmonary exam normal breath sounds clear to auscultation       Cardiovascular negative cardio ROS Normal cardiovascular exam Rhythm:regular Rate:Normal     Neuro/Psych negative neurological ROS     GI/Hepatic negative GI ROS, Neg liver ROS,   Endo/Other  negative endocrine ROS  Renal/GU negative Renal ROS     Musculoskeletal   Abdominal   Peds  Hematology negative hematology ROS (+)   Anesthesia Other Findings Patient does have Chronic leukocytic leukemia  Reproductive/Obstetrics negative OB ROS                           Anesthesia Physical Anesthesia Plan  ASA: II  Anesthesia Plan: MAC and Bier Block   Post-op Pain Management:    Induction: Intravenous  Airway Management Planned: Simple Face Mask  Additional Equipment:   Intra-op Plan:   Post-operative Plan:   Informed Consent: I have reviewed the patients History and Physical, chart, labs and discussed the procedure including the risks, benefits and alternatives for the proposed anesthesia with the patient or authorized representative who has indicated his/her understanding and acceptance.   Dental Advisory Given  Plan Discussed with: Anesthesiologist, CRNA and Surgeon  Anesthesia Plan Comments:        Anesthesia Quick Evaluation

## 2015-10-09 NOTE — Discharge Instructions (Addendum)

## 2015-10-09 NOTE — Anesthesia Postprocedure Evaluation (Signed)
Anesthesia Post Note  Patient: Wendy Hamilton  Procedure(s) Performed: Procedure(s) (LRB): EXCISION MUCOID CYST (Right)  Patient location during evaluation: PACU Anesthesia Type: MAC and Bier Block Level of consciousness: awake and alert Pain management: pain level controlled Vital Signs Assessment: post-procedure vital signs reviewed and stable Respiratory status: spontaneous breathing, nonlabored ventilation, respiratory function stable and patient connected to nasal cannula oxygen Cardiovascular status: stable and blood pressure returned to baseline Anesthetic complications: no    Last Vitals:  Filed Vitals:   10/09/15 1016 10/09/15 1030  BP: 135/66 124/61  Pulse: 56 51  Temp: 36.5 C   Resp: 16 11    Last Pain:  Filed Vitals:   10/09/15 1039  PainSc: 0-No pain                 Zenaida Deed

## 2015-10-11 ENCOUNTER — Encounter (HOSPITAL_BASED_OUTPATIENT_CLINIC_OR_DEPARTMENT_OTHER): Payer: Self-pay | Admitting: Orthopedic Surgery

## 2015-11-06 ENCOUNTER — Telehealth: Payer: Self-pay | Admitting: Oncology

## 2015-11-06 NOTE — Telephone Encounter (Signed)
s.w. pt and r/s appt due to MD block sched....pt ok and aware 6.29 time changed to later....pt ok and aware

## 2015-11-29 DIAGNOSIS — N3941 Urge incontinence: Secondary | ICD-10-CM | POA: Diagnosis not present

## 2015-11-29 DIAGNOSIS — N390 Urinary tract infection, site not specified: Secondary | ICD-10-CM | POA: Diagnosis not present

## 2015-12-13 ENCOUNTER — Other Ambulatory Visit (HOSPITAL_BASED_OUTPATIENT_CLINIC_OR_DEPARTMENT_OTHER): Payer: Medicare Other

## 2015-12-13 ENCOUNTER — Ambulatory Visit (HOSPITAL_BASED_OUTPATIENT_CLINIC_OR_DEPARTMENT_OTHER): Payer: Medicare Other | Admitting: Oncology

## 2015-12-13 ENCOUNTER — Other Ambulatory Visit: Payer: Medicare Other

## 2015-12-13 ENCOUNTER — Ambulatory Visit: Payer: Medicare Other | Admitting: Oncology

## 2015-12-13 ENCOUNTER — Telehealth: Payer: Self-pay | Admitting: Oncology

## 2015-12-13 VITALS — BP 159/79 | HR 72 | Temp 98.3°F | Resp 20 | Ht 61.2 in | Wt 112.6 lb

## 2015-12-13 DIAGNOSIS — C9110 Chronic lymphocytic leukemia of B-cell type not having achieved remission: Secondary | ICD-10-CM | POA: Diagnosis not present

## 2015-12-13 LAB — CBC WITH DIFFERENTIAL/PLATELET
BASO%: 0.2 % (ref 0.0–2.0)
BASOS ABS: 0 10*3/uL (ref 0.0–0.1)
EOS ABS: 0 10*3/uL (ref 0.0–0.5)
EOS%: 0.1 % (ref 0.0–7.0)
HEMATOCRIT: 43 % (ref 34.8–46.6)
HGB: 14 g/dL (ref 11.6–15.9)
LYMPH#: 18.3 10*3/uL — AB (ref 0.9–3.3)
LYMPH%: 80 % — ABNORMAL HIGH (ref 14.0–49.7)
MCH: 29.9 pg (ref 25.1–34.0)
MCHC: 32.6 g/dL (ref 31.5–36.0)
MCV: 91.7 fL (ref 79.5–101.0)
MONO#: 0.6 10*3/uL (ref 0.1–0.9)
MONO%: 2.6 % (ref 0.0–14.0)
NEUT#: 3.9 10*3/uL (ref 1.5–6.5)
NEUT%: 17.1 % — ABNORMAL LOW (ref 38.4–76.8)
Platelets: 268 10*3/uL (ref 145–400)
RBC: 4.69 10*6/uL (ref 3.70–5.45)
RDW: 12.9 % (ref 11.2–14.5)
WBC: 22.8 10*3/uL — ABNORMAL HIGH (ref 3.9–10.3)

## 2015-12-13 LAB — COMPREHENSIVE METABOLIC PANEL
ALT: 17 U/L (ref 0–55)
AST: 18 U/L (ref 5–34)
Albumin: 4.1 g/dL (ref 3.5–5.0)
Alkaline Phosphatase: 85 U/L (ref 40–150)
Anion Gap: 9 mEq/L (ref 3–11)
BUN: 18.9 mg/dL (ref 7.0–26.0)
CHLORIDE: 104 meq/L (ref 98–109)
CO2: 28 meq/L (ref 22–29)
CREATININE: 0.8 mg/dL (ref 0.6–1.1)
Calcium: 10.7 mg/dL — ABNORMAL HIGH (ref 8.4–10.4)
EGFR: 71 mL/min/{1.73_m2} — ABNORMAL LOW (ref >90)
GLUCOSE: 96 mg/dL (ref 70–140)
Potassium: 5 mEq/L (ref 3.5–5.1)
Sodium: 142 mEq/L (ref 136–145)
Total Bilirubin: 1.19 mg/dL (ref 0.20–1.20)
Total Protein: 7.2 g/dL (ref 6.4–8.3)

## 2015-12-13 NOTE — Progress Notes (Signed)
Hematology and Oncology Follow Up Visit  Wendy Ryser MD:[id number] [birth year] 70 y.o. 12/13/2015 2:57 PM Irven Shelling, MDGriffin, John, MD   Principle Diagnosis: 70 year old woman with chronic lymphocytic leukemia (CLL) diagnosed in September 2016. She presented with lymphocytosis and no other findings indicating stage 0 disease. Her disease was confirmed by peripheral blood flow cytometry.   Current therapy: Observation and surveillance.  Interim History: Wendy Hamilton presents today for a follow-up visit with her husband. Since the last visit, she continues to do very well without any changes in her health. She denied any fevers, weight loss or constitutional symptoms. Has not reported any recent infections or hospitalizations.  She denied any palpable lymphadenopathy or petechiae. She continues to function normally and performs activities of daily living without any decline. She did have surgery on her finger because of a cyst which she completed without complications. She is fully recovered at this time.  She does not report any headaches, blurry vision, syncope or seizures. She does not report any chills, sweats, weight loss or any other constitutional symptoms. She does not report any chest pain, palpitation, orthopnea or leg edema. She does not report any shortness of breath, hemoptysis or wheezing. She does not report any nausea, vomiting, abdominal pain, early satiety. She does not report any frequency, urgency or hematuria. She does report occasional dysuria. She does not report any skeletal complaints. Remaining review of systems unremarkable.  Medications: I have reviewed the patient's current medications. She only takes vitamin D supplements.   Allergies: No Known Allergies  Past Medical History, Surgical history, Social history, and Family History were reviewed and updated.   Physical Exam: Blood pressure 159/79, pulse 72, temperature 98.3 F (36.8 C), temperature source Oral, resp.  rate 20, height 5' 1.2" (1.554 m), weight 112 lb 9.6 oz (51.075 kg), SpO2 100 %. ECOG: 0 General appearance: Well-appearing woman without distress. Head: Normocephalic, without obvious abnormality no oral thrush noted. Neck: no adenopathy Lymph nodes: Cervical, supraclavicular, and axillary nodes normal. Heart:regular rate and rhythm, S1, S2 normal, no murmur, click, rub or gallop Lung:chest clear, no wheezing, rales, normal symmetric air entry Abdomin: Soft, nontender with good bowel sounds. No rebound or guarding or splenomegaly. EXT:no erythema, induration, or nodules   Lab Results: Lab Results  Component Value Date   WBC 22.8* 12/13/2015   HGB 14.0 12/13/2015   HCT 43.0 12/13/2015   MCV 91.7 12/13/2015   PLT 268 12/13/2015     Chemistry      Component Value Date/Time   NA 141 06/14/2015 0832   K 5.5* 06/14/2015 0832   CO2 26 06/14/2015 0832   BUN 18.2 06/14/2015 0832   CREATININE 0.8 06/14/2015 0832      Component Value Date/Time   CALCIUM 10.6* 06/14/2015 0832   ALKPHOS 87 06/14/2015 0832   AST 23 06/14/2015 0832   ALT 16 06/14/2015 0832   BILITOT 1.06 06/14/2015 0832       Impression and Plan:   70 year old woman with the following issues:  1. CLL diagnosed in September 2016 after presenting with lymphocytosis and appears to have stage 0 disease.   Her white cell count today is 22.8 which have not dramatically changed in the last year up from 12,000 in 2010. This indicates a rather indolent process and does not require any treatment at this time. I recommended continued observation and surveillance at this time.  I educated her about indications for treatment for CLL. These would include rapidly progressing white cell count, symptomatic lymphadenopathy, cytopenias,  recurrent infections or autoimmune hemolysis. She is aware of these issues and will report to me in between visits at any should occur.  2. Age-appropriate cancer screening: She is up-to-date at  this time which I recommended her to continue.  3. Follow-up: will be in 6 months.   [name], MD 6/29/20172:57 PM

## 2015-12-13 NOTE — Telephone Encounter (Signed)
Gave patient avs report and appointment for December.

## 2016-01-30 DIAGNOSIS — H43813 Vitreous degeneration, bilateral: Secondary | ICD-10-CM | POA: Diagnosis not present

## 2016-01-30 DIAGNOSIS — H25813 Combined forms of age-related cataract, bilateral: Secondary | ICD-10-CM | POA: Diagnosis not present

## 2016-01-30 DIAGNOSIS — H52221 Regular astigmatism, right eye: Secondary | ICD-10-CM | POA: Diagnosis not present

## 2016-01-30 DIAGNOSIS — H5203 Hypermetropia, bilateral: Secondary | ICD-10-CM | POA: Diagnosis not present

## 2016-01-30 DIAGNOSIS — H524 Presbyopia: Secondary | ICD-10-CM | POA: Diagnosis not present

## 2016-03-12 DIAGNOSIS — D225 Melanocytic nevi of trunk: Secondary | ICD-10-CM | POA: Diagnosis not present

## 2016-03-12 DIAGNOSIS — L821 Other seborrheic keratosis: Secondary | ICD-10-CM | POA: Diagnosis not present

## 2016-03-12 DIAGNOSIS — L820 Inflamed seborrheic keratosis: Secondary | ICD-10-CM | POA: Diagnosis not present

## 2016-03-12 DIAGNOSIS — L819 Disorder of pigmentation, unspecified: Secondary | ICD-10-CM | POA: Diagnosis not present

## 2016-03-12 DIAGNOSIS — L218 Other seborrheic dermatitis: Secondary | ICD-10-CM | POA: Diagnosis not present

## 2016-03-12 DIAGNOSIS — L570 Actinic keratosis: Secondary | ICD-10-CM | POA: Diagnosis not present

## 2016-03-25 ENCOUNTER — Ambulatory Visit
Admission: RE | Admit: 2016-03-25 | Discharge: 2016-03-25 | Disposition: A | Discharge status: Home / Self Care | Payer: Medicare Other | Source: Ambulatory Visit | Attending: Internal Medicine | Admitting: Internal Medicine

## 2016-03-25 ENCOUNTER — Other Ambulatory Visit: Payer: Self-pay | Admitting: Internal Medicine

## 2016-03-25 DIAGNOSIS — E049 Nontoxic goiter, unspecified: Secondary | ICD-10-CM | POA: Diagnosis not present

## 2016-03-25 DIAGNOSIS — E7800 Pure hypercholesterolemia, unspecified: Secondary | ICD-10-CM | POA: Diagnosis not present

## 2016-03-25 DIAGNOSIS — M810 Age-related osteoporosis without current pathological fracture: Secondary | ICD-10-CM | POA: Diagnosis not present

## 2016-03-25 DIAGNOSIS — E042 Nontoxic multinodular goiter: Secondary | ICD-10-CM | POA: Diagnosis not present

## 2016-03-25 DIAGNOSIS — Z23 Encounter for immunization: Secondary | ICD-10-CM | POA: Diagnosis not present

## 2016-03-25 DIAGNOSIS — R030 Elevated blood-pressure reading, without diagnosis of hypertension: Secondary | ICD-10-CM | POA: Diagnosis not present

## 2016-03-25 DIAGNOSIS — Z1389 Encounter for screening for other disorder: Secondary | ICD-10-CM | POA: Diagnosis not present

## 2016-03-27 ENCOUNTER — Other Ambulatory Visit: Payer: Self-pay | Admitting: Internal Medicine

## 2016-03-27 DIAGNOSIS — E042 Nontoxic multinodular goiter: Secondary | ICD-10-CM

## 2016-04-08 DIAGNOSIS — N390 Urinary tract infection, site not specified: Secondary | ICD-10-CM | POA: Diagnosis not present

## 2016-04-09 ENCOUNTER — Ambulatory Visit
Admission: RE | Admit: 2016-04-09 | Discharge: 2016-04-09 | Disposition: A | Discharge status: Home / Self Care | Payer: Medicare Other | Source: Ambulatory Visit | Attending: Internal Medicine | Admitting: Internal Medicine

## 2016-04-09 ENCOUNTER — Other Ambulatory Visit (HOSPITAL_COMMUNITY)
Admission: RE | Admit: 2016-04-09 | Discharge: 2016-04-09 | Disposition: A | Discharge status: Home / Self Care | Payer: Medicare Other | Source: Ambulatory Visit | Attending: General Surgery | Admitting: General Surgery

## 2016-04-09 DIAGNOSIS — E042 Nontoxic multinodular goiter: Secondary | ICD-10-CM

## 2016-04-09 DIAGNOSIS — E041 Nontoxic single thyroid nodule: Secondary | ICD-10-CM | POA: Diagnosis present

## 2016-06-04 ENCOUNTER — Other Ambulatory Visit (HOSPITAL_BASED_OUTPATIENT_CLINIC_OR_DEPARTMENT_OTHER): Payer: Medicare Other

## 2016-06-04 ENCOUNTER — Telehealth: Payer: Self-pay | Admitting: Oncology

## 2016-06-04 ENCOUNTER — Ambulatory Visit (HOSPITAL_BASED_OUTPATIENT_CLINIC_OR_DEPARTMENT_OTHER): Payer: Medicare Other | Admitting: Oncology

## 2016-06-04 VITALS — BP 151/66 | HR 80 | Temp 97.7°F | Resp 18 | Ht 61.2 in | Wt 112.6 lb

## 2016-06-04 DIAGNOSIS — C9110 Chronic lymphocytic leukemia of B-cell type not having achieved remission: Secondary | ICD-10-CM

## 2016-06-04 LAB — CBC WITH DIFFERENTIAL/PLATELET
BASO%: 0.1 % (ref 0.0–2.0)
BASOS ABS: 0 10*3/uL (ref 0.0–0.1)
EOS%: 0.1 % (ref 0.0–7.0)
Eosinophils Absolute: 0 10*3/uL (ref 0.0–0.5)
HCT: 42.4 % (ref 34.8–46.6)
HGB: 14 g/dL (ref 11.6–15.9)
LYMPH%: 81.2 % — AB (ref 14.0–49.7)
MCH: 30.6 pg (ref 25.1–34.0)
MCHC: 33 g/dL (ref 31.5–36.0)
MCV: 92.6 fL (ref 79.5–101.0)
MONO#: 0.6 10*3/uL (ref 0.1–0.9)
MONO%: 2.7 % (ref 0.0–14.0)
NEUT#: 3.4 10*3/uL (ref 1.5–6.5)
NEUT%: 15.9 % — AB (ref 38.4–76.8)
NRBC: 0 % (ref 0–0)
Platelets: 263 10*3/uL (ref 145–400)
RBC: 4.58 10*6/uL (ref 3.70–5.45)
RDW: 13.4 % (ref 11.2–14.5)
WBC: 21.1 10*3/uL — ABNORMAL HIGH (ref 3.9–10.3)
lymph#: 17.2 10*3/uL — ABNORMAL HIGH (ref 0.9–3.3)

## 2016-06-04 LAB — COMPREHENSIVE METABOLIC PANEL
ALT: 19 U/L (ref 0–55)
ANION GAP: 8 meq/L (ref 3–11)
AST: 20 U/L (ref 5–34)
Albumin: 3.8 g/dL (ref 3.5–5.0)
Alkaline Phosphatase: 94 U/L (ref 40–150)
BUN: 13.5 mg/dL (ref 7.0–26.0)
CO2: 29 meq/L (ref 22–29)
Calcium: 10.4 mg/dL (ref 8.4–10.4)
Chloride: 106 mEq/L (ref 98–109)
Creatinine: 0.8 mg/dL (ref 0.6–1.1)
EGFR: 69 mL/min/{1.73_m2} — AB (ref >90)
GLUCOSE: 109 mg/dL (ref 70–140)
POTASSIUM: 5.2 meq/L — AB (ref 3.5–5.1)
SODIUM: 143 meq/L (ref 136–145)
Total Bilirubin: 0.84 mg/dL (ref 0.20–1.20)
Total Protein: 7.1 g/dL (ref 6.4–8.3)

## 2016-06-04 LAB — TECHNOLOGIST REVIEW

## 2016-06-04 NOTE — Telephone Encounter (Signed)
Gave patient avs report and appointment for March  °

## 2016-06-04 NOTE — Telephone Encounter (Signed)
Gave patient avs report and appointments for June. Date per patient due to beach trip.

## 2016-06-04 NOTE — Progress Notes (Signed)
Hematology and Oncology Follow Up Visit  Wendy Hamilton [id number] 1945/10/03 70 y.o. 06/04/2016 9:20 AM Irven Shelling, MDGriffin, John, MD   Principle Diagnosis: 70 year old woman with chronic lymphocytic leukemia (CLL) diagnosed in September 2016. She presented with lymphocytosis and no other findings indicating stage 0 disease. Her disease was confirmed by peripheral blood flow cytometry.   Current therapy: Observation and surveillance.  Interim History: Wendy Hamilton presents today for a follow-up visit with her husband. Since the last visit, she reports no major complaints or changes. She continues to be in excellent health and shape without any symptoms. She denied any fevers, weight loss or constitutional symptoms. Has not reported any recent infections or hospitalizations.  She denied any palpable lymphadenopathy or petechiae.   She did have thyroid irregularities that have been evaluated with a fine-needle aspiration on 04/09/2016 which showed benign findings.  She does not report any headaches, blurry vision, syncope or seizures. She does not report any chills, sweats, weight loss or any other constitutional symptoms. She does not report any chest pain, palpitation, orthopnea or leg edema. She does not report any shortness of breath, hemoptysis or wheezing. She does not report any nausea, vomiting, abdominal pain, early satiety. She does not report any frequency, urgency or hematuria. She does report occasional dysuria. She does not report any skeletal complaints. Remaining review of systems unremarkable.  Medications: I have reviewed the patient's current medications. She only takes vitamin D supplements.   Allergies:  Allergies  Allergen Reactions  . Nitrofurantoin Rash    Joint pain    Past Medical History, Surgical history, Social history, and Family History were reviewed and updated.   Physical Exam: Blood pressure (!) 151/66, pulse 80, temperature 97.7 F (36.5 C),  temperature source Oral, resp. rate 18, height 5' 1.2" (1.554 m), weight 112 lb 9.6 oz (51.1 kg), SpO2 100 %. ECOG: 0 General appearance: Alert, awake woman without distress. Head: Normocephalic, without obvious abnormality no oral ulcers or lesions. Neck: no adenopathy Lymph nodes: Cervical, supraclavicular, and axillary nodes normal. Heart:regular rate and rhythm, S1, S2 normal, no murmur, click, rub or gallop Lung:chest clear, no wheezing, rales, normal symmetric air entry Abdomin: Soft, nontender with good bowel sounds. No shifting dullness or ascites. EXT:no erythema, induration, or nodules   Lab Results: Lab Results  Component Value Date   WBC 21.1 (H) 06/04/2016   HGB 14.0 06/04/2016   HCT 42.4 06/04/2016   MCV 92.6 06/04/2016   PLT 263 06/04/2016     Chemistry      Component Value Date/Time   NA 142 12/13/2015 1434   K 5.0 12/13/2015 1434   CO2 28 12/13/2015 1434   BUN 18.9 12/13/2015 1434   CREATININE 0.8 12/13/2015 1434      Component Value Date/Time   CALCIUM 10.7 (H) 12/13/2015 1434   ALKPHOS 85 12/13/2015 1434   AST 18 12/13/2015 1434   ALT 17 12/13/2015 1434   BILITOT 1.19 12/13/2015 1434       Impression and Plan:   70 year old woman with the following issues:  1. CLL diagnosed in September 2016 after presenting with lymphocytosis and appears to have stage 0 disease.   Her Laboratory data were personally reviewed today and showed no dramatic changes from previous counts. Her white cell count is 21,000 which is not dramatically changed from the last 2 years.  Indications for treatment for CLL were reviewed today. These would include rapidly progressing white cell count, symptomatic lymphadenopathy, cytopenias, recurrent infections or autoimmune hemolysis. She does  not have any of these signs or symptoms to warrant treatment. I recommended continued observation and surveillance.  2. Age-appropriate cancer screening: She is up-to-date at this time which  I recommended her to continue.  3. Follow-up: will be in 6 months.   [facility], MD 12/20/20179:20 AM

## 2016-09-27 IMAGING — CR DG CHEST 2V
2 series · 2 of 2 positions shown · non-contrast
Comparison: None.

CLINICAL DATA: Cough

EXAM:
CHEST  2 VIEW

[w chest pa]
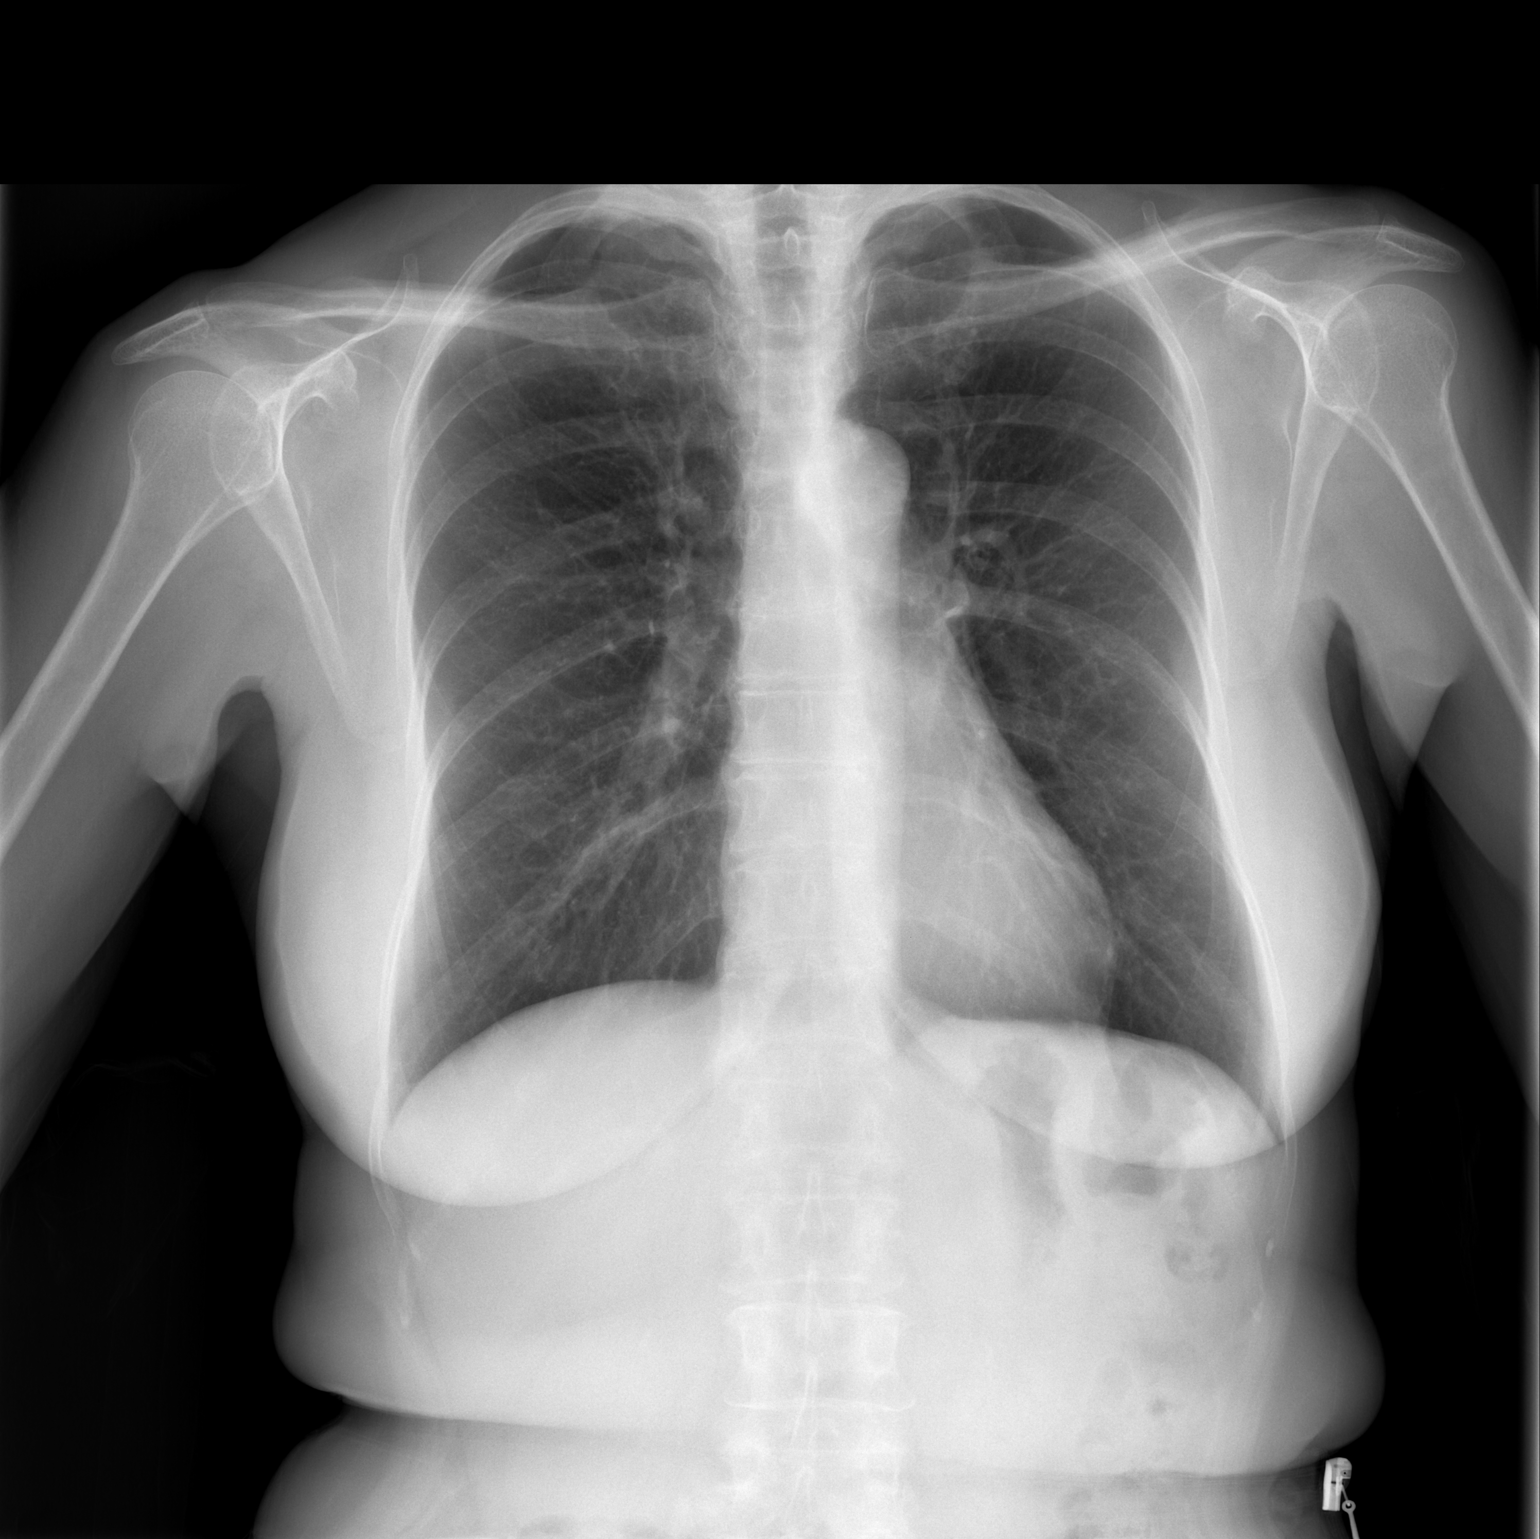

[w chest lat]
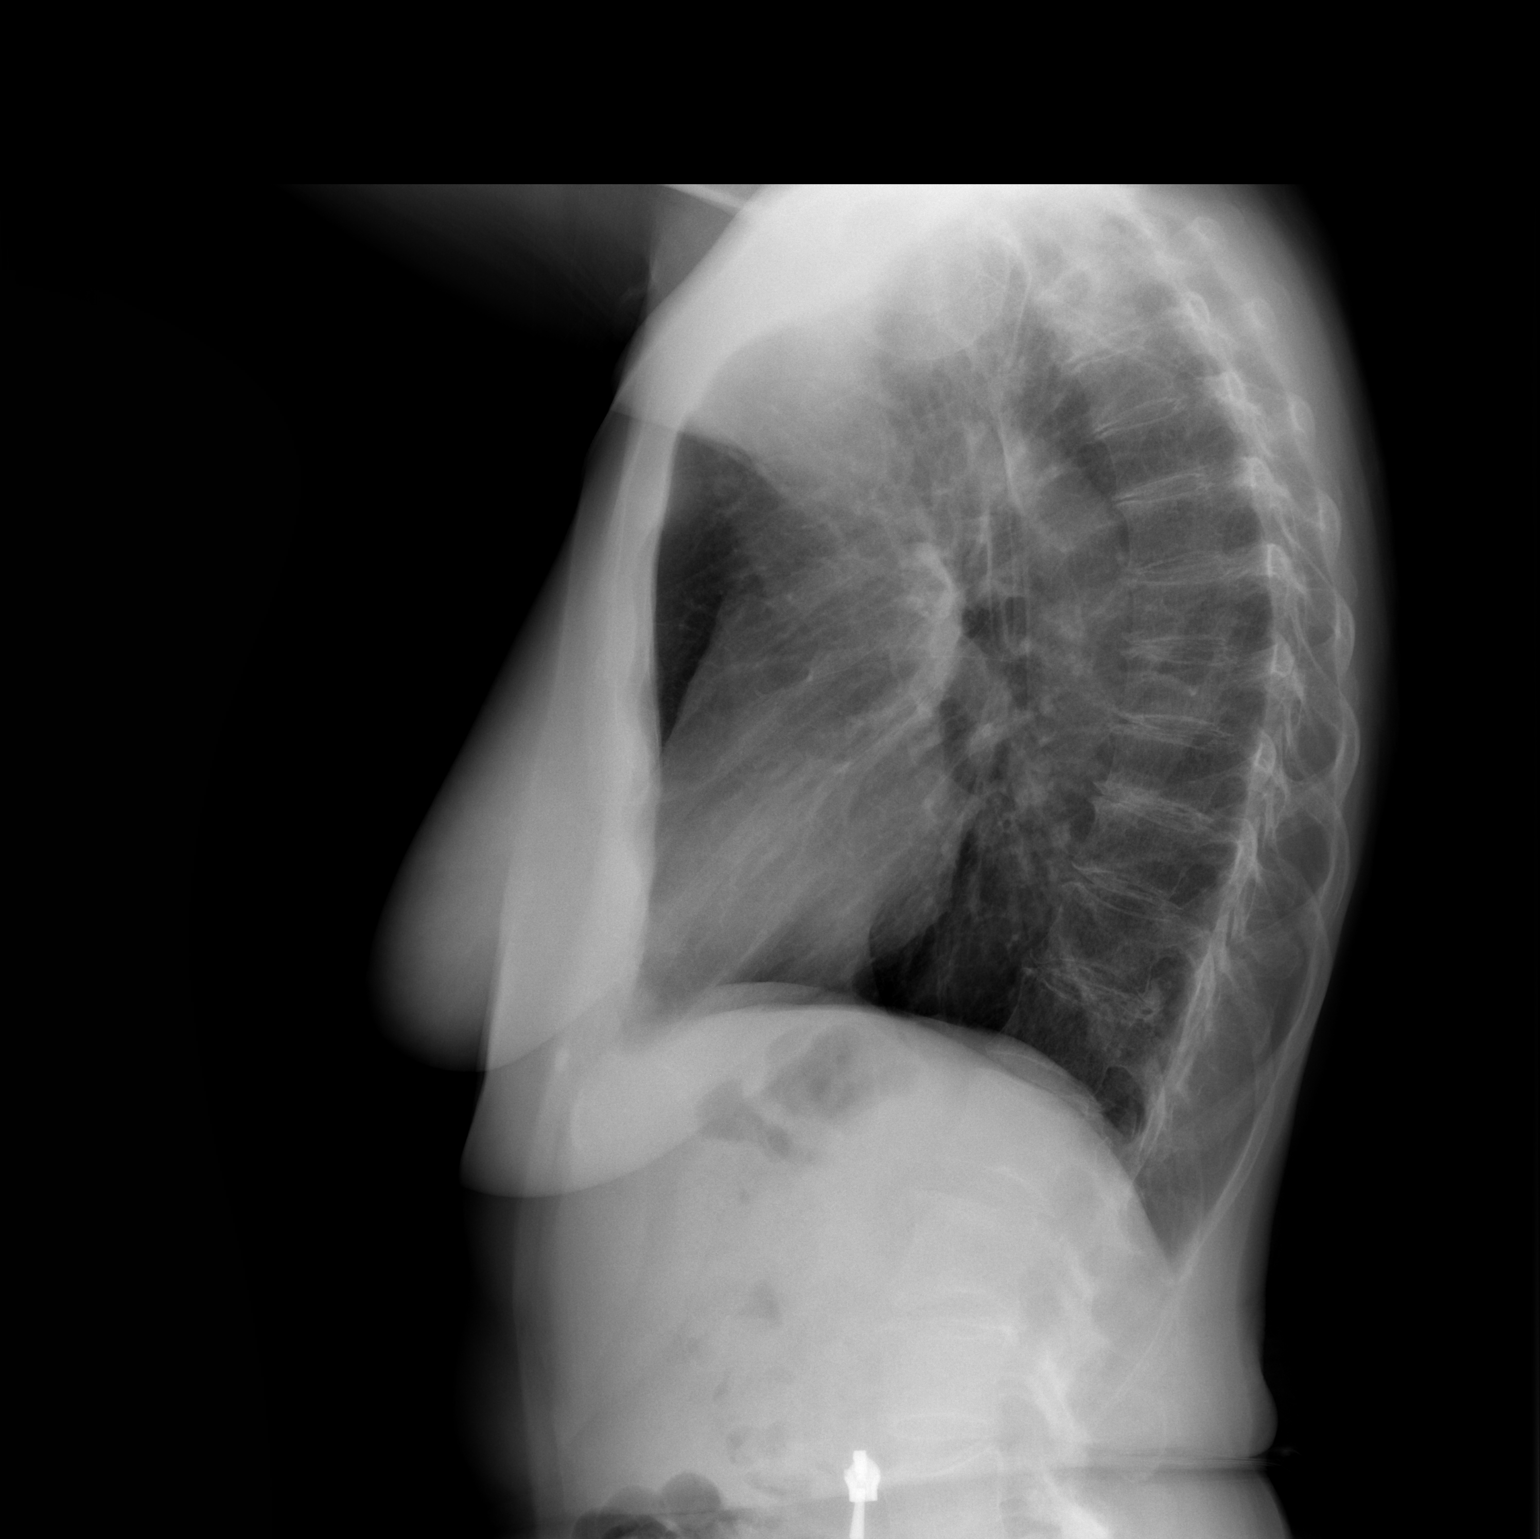

[2 of 2 positions shown; findings below may reference images not displayed]

FINDINGS: The heart size and mediastinal contours are within normal limits.
Both lungs are clear. The visualized skeletal structures are
unremarkable.
IMPRESSION: No active cardiopulmonary disease.

## 2016-10-16 DIAGNOSIS — R030 Elevated blood-pressure reading, without diagnosis of hypertension: Secondary | ICD-10-CM | POA: Diagnosis not present

## 2016-10-16 DIAGNOSIS — E7800 Pure hypercholesterolemia, unspecified: Secondary | ICD-10-CM | POA: Diagnosis not present

## 2016-12-09 ENCOUNTER — Telehealth: Payer: Self-pay | Admitting: Oncology

## 2016-12-09 ENCOUNTER — Other Ambulatory Visit (HOSPITAL_BASED_OUTPATIENT_CLINIC_OR_DEPARTMENT_OTHER): Payer: Medicare Other

## 2016-12-09 ENCOUNTER — Ambulatory Visit (HOSPITAL_BASED_OUTPATIENT_CLINIC_OR_DEPARTMENT_OTHER): Payer: Medicare Other | Admitting: Oncology

## 2016-12-09 VITALS — BP 145/67 | HR 79 | Temp 98.6°F | Resp 20 | Ht 61.0 in | Wt 112.1 lb

## 2016-12-09 DIAGNOSIS — C9110 Chronic lymphocytic leukemia of B-cell type not having achieved remission: Secondary | ICD-10-CM

## 2016-12-09 LAB — COMPREHENSIVE METABOLIC PANEL
ALBUMIN: 4 g/dL (ref 3.5–5.0)
ALK PHOS: 80 U/L (ref 40–150)
ALT: 14 U/L (ref 0–55)
ANION GAP: 10 meq/L (ref 3–11)
AST: 17 U/L (ref 5–34)
BUN: 16.8 mg/dL (ref 7.0–26.0)
CALCIUM: 10.8 mg/dL — AB (ref 8.4–10.4)
CHLORIDE: 105 meq/L (ref 98–109)
CO2: 27 mEq/L (ref 22–29)
Creatinine: 0.8 mg/dL (ref 0.6–1.1)
EGFR: 70 mL/min/{1.73_m2} — ABNORMAL LOW (ref >90)
Glucose: 118 mg/dl (ref 70–140)
POTASSIUM: 4.8 meq/L (ref 3.5–5.1)
Sodium: 142 mEq/L (ref 136–145)
Total Bilirubin: 1.29 mg/dL — ABNORMAL HIGH (ref 0.20–1.20)
Total Protein: 7 g/dL (ref 6.4–8.3)

## 2016-12-09 LAB — CBC WITH DIFFERENTIAL/PLATELET
BASO%: 0 % (ref 0.0–2.0)
BASOS ABS: 0 10*3/uL (ref 0.0–0.1)
EOS ABS: 0 10*3/uL (ref 0.0–0.5)
EOS%: 0.1 % (ref 0.0–7.0)
HEMATOCRIT: 42.1 % (ref 34.8–46.6)
HGB: 13.9 g/dL (ref 11.6–15.9)
LYMPH%: 81.3 % — ABNORMAL HIGH (ref 14.0–49.7)
MCH: 31 pg (ref 25.1–34.0)
MCHC: 33 g/dL (ref 31.5–36.0)
MCV: 94 fL (ref 79.5–101.0)
MONO#: 0.6 10*3/uL (ref 0.1–0.9)
MONO%: 2.8 % (ref 0.0–14.0)
NEUT#: 3.4 10*3/uL (ref 1.5–6.5)
NEUT%: 15.8 % — AB (ref 38.4–76.8)
PLATELETS: 252 10*3/uL (ref 145–400)
RBC: 4.48 10*6/uL (ref 3.70–5.45)
RDW: 13.1 % (ref 11.2–14.5)
WBC: 21.2 10*3/uL — ABNORMAL HIGH (ref 3.9–10.3)
lymph#: 17.3 10*3/uL — ABNORMAL HIGH (ref 0.9–3.3)
nRBC: 0 % (ref 0–0)

## 2016-12-09 NOTE — Progress Notes (Signed)
Hematology and Oncology Follow Up Visit  Trevia Nop [id number] [birth year] 71 y.o. 12/09/2016 9:20 AM [name], MDGriffin, [name], MD   Principle Diagnosis: 71 year old woman with chronic lymphocytic leukemia (CLL) diagnosed in September 2016. She presented with lymphocytosis and no other findings indicating stage 0 disease. Her disease was confirmed by peripheral blood flow cytometry.   Current therapy: Observation and surveillance.  Interim History: Ms. Shull presents today for a follow-up visit with her husband. Since the last visit, she reports doing well without any recent issues. She continues to be in excellent health and shape without any symptoms. She denied any fevers, weight loss or constitutional symptoms. Has not reported any recent infections or hospitalizations.  She denied any palpable lymphadenopathy or petechiae. She denied any excessive fatigue or hospitalizations. She continues to perform activities of daily living without any decline.  She does not report any headaches, blurry vision, syncope or seizures. She does not report any chills, sweats, weight loss or any other constitutional symptoms. She does not report any chest pain, palpitation, orthopnea or leg edema. She does not report any shortness of breath, hemoptysis or wheezing. She does not report any nausea, vomiting, abdominal pain, early satiety. She does not report any frequency, urgency or hematuria. She does report occasional dysuria. She does not report any skeletal complaints. Remaining review of systems unremarkable.  Medications: I have reviewed the patient's current medications. She only takes vitamin D supplements.   Allergies:  Allergies  Allergen Reactions  . Nitrofurantoin Rash    Joint pain    Past Medical History, Surgical history, Social history, and Family History were reviewed and updated.   Physical Exam: Blood pressure (!) 145/67, pulse 79, temperature 98.6 F (37 C), temperature  source Oral, resp. rate 20, height 5\' 1"  (1.549 m), weight 112 lb 1.6 oz (50.8 kg), SpO2 100 %. ECOG: 0 General appearance: Well appearing woman without distress. Head: Normocephalic, without obvious abnormality no oral ulcers or lesions. Neck: no adenopathy Lymph nodes: Cervical, supraclavicular, and axillary nodes normal. Heart:regular rate and rhythm, S1, S2 normal, no murmur, click, rub or gallop Lung:chest clear, no wheezing, rales, normal symmetric air entry Abdomin: Soft, nontender with good bowel sounds. No rebound or guarding. EXT:no erythema, induration, or nodules   Lab Results: Lab Results  Component Value Date   WBC 21.2 (H) 12/09/2016   HGB 13.9 12/09/2016   HCT 42.1 12/09/2016   MCV 94.0 12/09/2016   PLT 252 12/09/2016     Chemistry      Component Value Date/Time   NA 143 06/04/2016 0851   K 5.2 (H) 06/04/2016 0851   CO2 29 06/04/2016 0851   BUN 13.5 06/04/2016 0851   CREATININE 0.8 06/04/2016 0851      Component Value Date/Time   CALCIUM 10.4 06/04/2016 0851   ALKPHOS 94 06/04/2016 0851   AST 20 06/04/2016 0851   ALT 19 06/04/2016 0851   BILITOT 0.84 06/04/2016 0851       Impression and Plan:   71 year old woman with the following issues:  1. CLL diagnosed in September 2016 after presenting with lymphocytosis and appears to have stage 0 disease.   Her CBC was reviewed today and her white cell count not dramatically changed in the last 2 years. I see no indications to change our approach at this time.  Indications for treatment for CLL were discussed again. These would include rapidly progressing white cell count, symptomatic lymphadenopathy, cytopenias, recurrent infections or autoimmune hemolysis. She does not have any of  these signs or symptoms to warrant treatment.   I recommended continued observation and surveillance.  2. Age-appropriate cancer screening: She is up-to-date at this time which I recommended her to continue.  3. Follow-up: will  be in 6 months.   [facility], MD 6/26/20189:20 AM

## 2016-12-09 NOTE — Telephone Encounter (Signed)
Appointments scheduled per 12/09/16 los. °Patient was given a copy of the AVS report and appointment schedule per 12/09/16 los. °

## 2017-01-08 DIAGNOSIS — N390 Urinary tract infection, site not specified: Secondary | ICD-10-CM | POA: Diagnosis not present

## 2017-03-25 ENCOUNTER — Other Ambulatory Visit: Payer: Self-pay | Admitting: Family Medicine

## 2017-03-25 DIAGNOSIS — Z1159 Encounter for screening for other viral diseases: Secondary | ICD-10-CM | POA: Diagnosis not present

## 2017-03-25 DIAGNOSIS — Z1389 Encounter for screening for other disorder: Secondary | ICD-10-CM | POA: Diagnosis not present

## 2017-03-25 DIAGNOSIS — R7301 Impaired fasting glucose: Secondary | ICD-10-CM | POA: Diagnosis not present

## 2017-03-25 DIAGNOSIS — M810 Age-related osteoporosis without current pathological fracture: Secondary | ICD-10-CM | POA: Diagnosis not present

## 2017-03-25 DIAGNOSIS — Z23 Encounter for immunization: Secondary | ICD-10-CM | POA: Diagnosis not present

## 2017-03-25 DIAGNOSIS — E7800 Pure hypercholesterolemia, unspecified: Secondary | ICD-10-CM | POA: Diagnosis not present

## 2017-03-25 DIAGNOSIS — Z1231 Encounter for screening mammogram for malignant neoplasm of breast: Secondary | ICD-10-CM

## 2017-03-25 DIAGNOSIS — Z0000 Encounter for general adult medical examination without abnormal findings: Secondary | ICD-10-CM | POA: Diagnosis not present

## 2017-04-01 DIAGNOSIS — R7301 Impaired fasting glucose: Secondary | ICD-10-CM | POA: Diagnosis not present

## 2017-04-14 ENCOUNTER — Ambulatory Visit
Admission: RE | Admit: 2017-04-14 | Discharge: 2017-04-14 | Disposition: A | Discharge status: Home / Self Care | Payer: Medicare Other | Source: Ambulatory Visit | Attending: Family Medicine | Admitting: Family Medicine

## 2017-04-14 DIAGNOSIS — Z1231 Encounter for screening mammogram for malignant neoplasm of breast: Secondary | ICD-10-CM

## 2017-05-13 DIAGNOSIS — D2272 Melanocytic nevi of left lower limb, including hip: Secondary | ICD-10-CM | POA: Diagnosis not present

## 2017-05-13 DIAGNOSIS — L570 Actinic keratosis: Secondary | ICD-10-CM | POA: Diagnosis not present

## 2017-05-13 DIAGNOSIS — D1801 Hemangioma of skin and subcutaneous tissue: Secondary | ICD-10-CM | POA: Diagnosis not present

## 2017-05-13 DIAGNOSIS — D2271 Melanocytic nevi of right lower limb, including hip: Secondary | ICD-10-CM | POA: Diagnosis not present

## 2017-05-13 DIAGNOSIS — L814 Other melanin hyperpigmentation: Secondary | ICD-10-CM | POA: Diagnosis not present

## 2017-05-13 DIAGNOSIS — L819 Disorder of pigmentation, unspecified: Secondary | ICD-10-CM | POA: Diagnosis not present

## 2017-05-13 DIAGNOSIS — D224 Melanocytic nevi of scalp and neck: Secondary | ICD-10-CM | POA: Diagnosis not present

## 2017-05-13 DIAGNOSIS — D225 Melanocytic nevi of trunk: Secondary | ICD-10-CM | POA: Diagnosis not present

## 2017-05-13 DIAGNOSIS — L821 Other seborrheic keratosis: Secondary | ICD-10-CM | POA: Diagnosis not present

## 2017-05-29 DIAGNOSIS — Z124 Encounter for screening for malignant neoplasm of cervix: Secondary | ICD-10-CM | POA: Diagnosis not present

## 2017-05-29 DIAGNOSIS — Z01419 Encounter for gynecological examination (general) (routine) without abnormal findings: Secondary | ICD-10-CM | POA: Diagnosis not present

## 2017-07-06 ENCOUNTER — Telehealth: Payer: Self-pay | Admitting: Oncology

## 2017-07-06 NOTE — Telephone Encounter (Signed)
Returned phone call after voicemail was left - spoke with patient and confirmed appts.

## 2017-07-08 ENCOUNTER — Telehealth: Payer: Self-pay | Admitting: Oncology

## 2017-07-08 ENCOUNTER — Inpatient Hospital Stay: Payer: Medicare Other | Attending: Oncology | Admitting: Oncology

## 2017-07-08 ENCOUNTER — Inpatient Hospital Stay: Payer: Medicare Other

## 2017-07-08 VITALS — BP 138/70 | HR 100 | Temp 98.8°F | Resp 18 | Ht 61.0 in | Wt 113.0 lb

## 2017-07-08 DIAGNOSIS — D72820 Lymphocytosis (symptomatic): Secondary | ICD-10-CM | POA: Insufficient documentation

## 2017-07-08 DIAGNOSIS — C9110 Chronic lymphocytic leukemia of B-cell type not having achieved remission: Secondary | ICD-10-CM

## 2017-07-08 LAB — CBC WITH DIFFERENTIAL/PLATELET
BASOS ABS: 0 10*3/uL (ref 0.0–0.1)
Basophils Relative: 0 %
Eosinophils Absolute: 0.1 10*3/uL (ref 0.0–0.5)
Eosinophils Relative: 0 %
HEMATOCRIT: 43.4 % (ref 34.8–46.6)
HEMOGLOBIN: 14.4 g/dL (ref 11.6–15.9)
LYMPHS ABS: 17.7 10*3/uL — AB (ref 0.9–3.3)
LYMPHS PCT: 81 %
MCH: 31.3 pg (ref 25.1–34.0)
MCHC: 33.2 g/dL (ref 31.5–36.0)
MCV: 94.3 fL (ref 79.5–101.0)
Monocytes Absolute: 0.5 10*3/uL (ref 0.1–0.9)
Monocytes Relative: 3 %
NEUTROS ABS: 3.4 10*3/uL (ref 1.5–6.5)
NEUTROS PCT: 16 %
Platelets: 261 10*3/uL (ref 145–400)
RBC: 4.6 MIL/uL (ref 3.70–5.45)
RDW: 12.9 % (ref 11.2–16.1)
WBC: 21.7 10*3/uL — ABNORMAL HIGH (ref 3.9–10.3)

## 2017-07-08 LAB — COMPREHENSIVE METABOLIC PANEL
ALBUMIN: 3.8 g/dL (ref 3.5–5.0)
ALK PHOS: 85 U/L (ref 40–150)
ALT: 19 U/L (ref 0–55)
ANION GAP: 6 (ref 3–11)
AST: 19 U/L (ref 5–34)
BUN: 18 mg/dL (ref 7–26)
CHLORIDE: 106 mmol/L (ref 98–109)
CO2: 29 mmol/L (ref 22–29)
Calcium: 10.1 mg/dL (ref 8.4–10.4)
Creatinine, Ser: 0.82 mg/dL (ref 0.60–1.10)
GFR calc Af Amer: >60 mL/min (ref >60)
GFR calc non Af Amer: >60 mL/min (ref >60)
GLUCOSE: 92 mg/dL (ref 70–140)
POTASSIUM: 4.4 mmol/L (ref 3.3–4.7)
SODIUM: 141 mmol/L (ref 136–145)
Total Bilirubin: 1.2 mg/dL (ref 0.2–1.2)
Total Protein: 6.7 g/dL (ref 6.4–8.3)

## 2017-07-08 NOTE — Telephone Encounter (Signed)
Spoke with patient re 9/18 lab/fu. No los at time of check out.

## 2017-07-08 NOTE — Progress Notes (Signed)
Hematology and Oncology Follow Up Visit  Wendy Hamilton [id number] 1946/04/23 72 y.o. 07/08/2017 8:47 AM [name], MDGriffin, [name], MD   Principle Diagnosis: 72 year old woman with stage 0 chronic lymphocytic leukemia (CLL) diagnosed in September 2016.  Flow cytometry at that time confirmed the diagnosis.  She has no indication for treatment.   Current therapy: Observation and surveillance.  Interim History: Wendy Hamilton presents is here for a follow-up visit.  She continues to do well without any recent complaints since last visit.  She had a mammogram done in the last 3 months and her last colonoscopy was 3 years ago.  She denied any lymphadenopathy, fevers or night sweats.  Her energy and performance status remained unchanged.  Her quality of life remains excellent with excellent performance status.  She continues to be active and attends to activities of daily living.  She denies any recent infections such as bronchitis or pneumonia.  She denied having the flu.  She does not report any headaches, blurry vision, syncope or seizures. She does not report any chills, sweats, weight loss. She does not report any chest pain, palpitation, orthopnea or leg edema. She does not report any shortness of breath, hemoptysis or wheezing. She does not report any nausea, vomiting, abdominal pain, early satiety. She does not report any frequency, urgency or hematuria. She does not report any skeletal complaints. Remaining review of systems is negative  Medications: I have reviewed the patient's current medications.  She does not take any new medication.   Allergies:  Allergies  Allergen Reactions  . Nitrofurantoin Rash    Joint pain    Past Medical History, Surgical history, Social history, and Family History reviewed again and remain unchanged.   Physical Exam: Her blood pressure is 138/70, pulse is 100 respiration 18 with temperature of 98.8.  She weighs 113.0 pounds.  ECOG: 0 General  appearance: Alert, awake woman without distress. Head: Normocephalic, without obvious abnormality  Oropharynx: No oral ulcers or thrush. Eyes: No scleral icterus. Lymph nodes: Cervical, supraclavicular, and axillary nodes normal. Heart: No murmurs or gallops.  Regular rate and rhythm. Lung: clear to auscultation, no wheezing, rales, normal symmetric air entry Abdomin: Soft, nontender with good bowel sounds. No shifting dullness or ascites. Musculoskeletal: No joint deformity or effusion.   Lab Results: Lab Results  Component Value Date   WBC 21.2 (H) 12/09/2016   HGB 13.9 12/09/2016   HCT 42.1 12/09/2016   MCV 94.0 12/09/2016   PLT 252 12/09/2016     Chemistry      Component Value Date/Time   NA 142 12/09/2016 0839   K 4.8 12/09/2016 0839   CO2 27 12/09/2016 0839   BUN 16.8 12/09/2016 0839   CREATININE 0.8 12/09/2016 0839      Component Value Date/Time   CALCIUM 10.8 (H) 12/09/2016 0839   ALKPHOS 80 12/09/2016 0839   AST 17 12/09/2016 0839   ALT 14 12/09/2016 0839   BILITOT 1.29 (H) 12/09/2016 0839       Impression and Plan:   72 year old woman with the following issues:  1. CLL diagnosed in September 2016 after presenting with lymphocytosis with stage 0 at the time of diagnosis confirmed by flow cytometry.  Her laboratory data were reviewed today personally and discussed with the patient.  Her white cell count continues to be stable with lymphocytosis not dramatically changed.  The natural course of this disease was reviewed again with the patient.  Indication to start treatment were discussed which include symptomatic lymphadenopathy, constitutional  symptoms, rapid increase in her white cell count, autoimmune cytopenias, or recurrent infections.  She has none of these symptoms at this time but she will continue to follow periodically.  2. Age-appropriate cancer screening: She is up-to-date on her mammography.  Her last colonoscopy was 3 years ago.  3. Follow-up:  will be in 8 months.  15 minutes was spent with her face-to-face today.  More than 50% of time was dedicated to patient counseling, education and answering questions.   [name], MD 1/23/20198:47 AM

## 2017-10-15 DIAGNOSIS — H5203 Hypermetropia, bilateral: Secondary | ICD-10-CM | POA: Diagnosis not present

## 2017-10-15 DIAGNOSIS — H524 Presbyopia: Secondary | ICD-10-CM | POA: Diagnosis not present

## 2017-10-15 DIAGNOSIS — H25813 Combined forms of age-related cataract, bilateral: Secondary | ICD-10-CM | POA: Diagnosis not present

## 2017-10-15 DIAGNOSIS — H43393 Other vitreous opacities, bilateral: Secondary | ICD-10-CM | POA: Diagnosis not present

## 2017-10-15 DIAGNOSIS — H52221 Regular astigmatism, right eye: Secondary | ICD-10-CM | POA: Diagnosis not present

## 2017-10-15 DIAGNOSIS — H11153 Pinguecula, bilateral: Secondary | ICD-10-CM | POA: Diagnosis not present

## 2017-12-29 DIAGNOSIS — N390 Urinary tract infection, site not specified: Secondary | ICD-10-CM | POA: Diagnosis not present

## 2018-03-03 ENCOUNTER — Inpatient Hospital Stay: Payer: Medicare Other

## 2018-03-03 ENCOUNTER — Inpatient Hospital Stay: Payer: Medicare Other | Attending: Oncology | Admitting: Oncology

## 2018-03-03 ENCOUNTER — Telehealth: Payer: Self-pay | Admitting: Oncology

## 2018-03-03 VITALS — BP 157/78 | HR 79 | Temp 98.0°F | Resp 17 | Ht 61.0 in | Wt 108.4 lb

## 2018-03-03 DIAGNOSIS — C9110 Chronic lymphocytic leukemia of B-cell type not having achieved remission: Secondary | ICD-10-CM | POA: Diagnosis not present

## 2018-03-03 LAB — CMP (CANCER CENTER ONLY)
ALK PHOS: 88 U/L (ref 38–126)
ALT: 17 U/L (ref 0–44)
AST: 18 U/L (ref 15–41)
Albumin: 3.9 g/dL (ref 3.5–5.0)
Anion gap: 11 (ref 5–15)
BILIRUBIN TOTAL: 1.1 mg/dL (ref 0.3–1.2)
BUN: 21 mg/dL (ref 8–23)
CALCIUM: 10.9 mg/dL — AB (ref 8.9–10.3)
CO2: 27 mmol/L (ref 22–32)
CREATININE: 0.8 mg/dL (ref 0.44–1.00)
Chloride: 103 mmol/L (ref 98–111)
GFR, Est AFR Am: >60 mL/min (ref >60)
Glucose, Bld: 146 mg/dL — ABNORMAL HIGH (ref 70–99)
Potassium: 4.9 mmol/L (ref 3.5–5.1)
SODIUM: 141 mmol/L (ref 135–145)
TOTAL PROTEIN: 6.8 g/dL (ref 6.5–8.1)

## 2018-03-03 LAB — CBC WITH DIFFERENTIAL (CANCER CENTER ONLY)
BASOS ABS: 0 10*3/uL (ref 0.0–0.1)
Basophils Relative: 0 %
EOS ABS: 0 10*3/uL (ref 0.0–0.5)
Eosinophils Relative: 0 %
HCT: 43.8 % (ref 34.8–46.6)
HEMOGLOBIN: 13.9 g/dL (ref 11.6–15.9)
LYMPHS ABS: 21.3 10*3/uL — AB (ref 0.9–3.3)
LYMPHS PCT: 87 %
MCH: 30 pg (ref 25.1–34.0)
MCHC: 31.7 g/dL (ref 31.5–36.0)
MCV: 94.6 fL (ref 79.5–101.0)
Monocytes Absolute: 0.5 10*3/uL (ref 0.1–0.9)
Monocytes Relative: 2 %
NEUTROS ABS: 2.7 10*3/uL (ref 1.5–6.5)
NEUTROS PCT: 11 %
Platelet Count: 261 10*3/uL (ref 145–400)
RBC: 4.63 MIL/uL (ref 3.70–5.45)
RDW: 13.3 % (ref 11.2–14.5)
WBC Count: 24.6 10*3/uL — ABNORMAL HIGH (ref 3.9–10.3)

## 2018-03-03 NOTE — Progress Notes (Signed)
Hematology and Oncology Follow Up Visit  Wendy Hamilton [id number] 07/04/1945 72 y.o. 03/03/2018 8:05 AM [name], MDGriffin, [name], MD   Principle Diagnosis: 72 year old woman with chronic lymphocytic leukemia (CLL) presented with lymphocytosis and no lymphadenopathy confirmed by flow cytometry.  These findings indicate stage 0 diagnosed in September 2016.     Current therapy: Active surveillance.  Interim History: Ms. Wendy Hamilton presents today for a follow-up visit.  Since the last visit, she reports no changes or complaints.  She continues to feel well without any changes in her performance status or activity level.  She denies any painful adenopathy or constitutional symptoms.  Her appetite is excellent and have intentionally lost weight after cutting certain sugars.  She has been exercising regularly.  She denies any early satiety or changes in her mood.  She continues to have excellent performance status.   She does not report any headaches, blurry vision, syncope or seizures.  She denies any dizziness or alteration of mental status.  She does not report any chills, sweats, weight loss. She does not report any chest pain, palpitation, orthopnea or leg edema. She does not report any shortness of breath, hemoptysis or wheezing. She does not report any nausea, vomiting, abdominal pain.  She denies any change in her bowel habits.  She does not report any frequency, urgency or hematuria. She does not report any arthralgias or myalgias. Remaining review of systems is negative  Medications: I have reviewed the patient's current medications.  She does not take any new medication.   Allergies:  Allergies  Allergen Reactions  . Nitrofurantoin Rash and Other (See Comments)    Joint pain    Past Medical History, Surgical history, Social history, and Family History reviewed again and remain unchanged.   Physical Exam:  Blood pressure (!) 157/78, pulse 79, temperature 98 F (36.7 C),  temperature source Oral, resp. rate 17, height 5\' 1"  (1.549 m), weight 108 lb 6.4 oz (49.2 kg), SpO2 100 %.    ECOG: 0   General appearance: Comfortable appearing without any discomfort Head: Normocephalic without any trauma Oropharynx: Mucous membranes are moist and pink without any thrush or ulcers. Eyes: Pupils are equal and round reactive to light. Lymph nodes: No cervical, supraclavicular, inguinal or axillary lymphadenopathy.   Heart:regular rate and rhythm.  S1 and S2 without leg edema. Lung: Clear without any rhonchi or wheezes.  No dullness to percussion. Abdomin: Soft, nontender, nondistended with good bowel sounds.  No hepatosplenomegaly. Musculoskeletal: No joint deformity or effusion.  Full range of motion noted. Neurological: No deficits noted on motor, sensory and deep tendon reflex exam. Skin: No petechial rash or dryness.  Appeared moist.     Lab Results: Lab Results  Component Value Date   WBC 21.7 (H) 07/08/2017   HGB 14.4 07/08/2017   HCT 43.4 07/08/2017   MCV 94.3 07/08/2017   PLT 261 07/08/2017     Chemistry      Component Value Date/Time   NA 141 07/08/2017 0742   NA 142 12/09/2016 0839   K 4.4 07/08/2017 0742   K 4.8 12/09/2016 0839   CL 106 07/08/2017 0742   CO2 29 07/08/2017 0742   CO2 27 12/09/2016 0839   BUN 18 07/08/2017 0742   BUN 16.8 12/09/2016 0839   CREATININE 0.82 07/08/2017 0742   CREATININE 0.8 12/09/2016 0839      Component Value Date/Time   CALCIUM 10.1 07/08/2017 0742   CALCIUM 10.8 (H) 12/09/2016 0839   ALKPHOS 85 07/08/2017 0742  ALKPHOS 80 12/09/2016 0839   AST 19 07/08/2017 0742   AST 17 12/09/2016 0839   ALT 19 07/08/2017 0742   ALT 14 12/09/2016 0839   BILITOT 1.2 07/08/2017 0742   BILITOT 1.29 (H) 12/09/2016 0839       Impression and Plan:   72 year old woman with the following issues:  1.  Stage 0 chronic lymphocytic leukemia (CLL) diagnosed in September 2016.  She has no evidence of lymphadenopathy or  organ involvement.  Her CBC was personally reviewed today and showed her white cell count to be 24,000 which is not change from previous examination.  Her lymphocyte count slightly elevated which is consistent with this disease.  The natural course of this illness as well as indication for treatment were reviewed today.  These indications include symptomatic lymphadenopathy, constitutional symptoms, cytopenias among others.  At this time, I see no indication for treatment and I recommended continue observation.  2. Age-appropriate cancer screening: She remains up-to-date at this time on her colonoscopy and mammography.  3. Follow-up: will be in 8 months.  15 minutes was spent with her face-to-face today.  More than 50% of time was dedicated to reviewing the natural course of this disease, treatment options and coordinating plan of care.   [name], MD 9/18/20198:05 AM

## 2018-03-03 NOTE — Telephone Encounter (Signed)
Gave patient avs report and appointments for May 2020.

## 2018-03-30 ENCOUNTER — Other Ambulatory Visit: Payer: Self-pay | Admitting: Internal Medicine

## 2018-03-30 DIAGNOSIS — R030 Elevated blood-pressure reading, without diagnosis of hypertension: Secondary | ICD-10-CM | POA: Diagnosis not present

## 2018-03-30 DIAGNOSIS — Z1389 Encounter for screening for other disorder: Secondary | ICD-10-CM | POA: Diagnosis not present

## 2018-03-30 DIAGNOSIS — M8580 Other specified disorders of bone density and structure, unspecified site: Secondary | ICD-10-CM

## 2018-03-30 DIAGNOSIS — E042 Nontoxic multinodular goiter: Secondary | ICD-10-CM | POA: Diagnosis not present

## 2018-03-30 DIAGNOSIS — H53123 Transient visual loss, bilateral: Secondary | ICD-10-CM | POA: Diagnosis not present

## 2018-03-30 DIAGNOSIS — Z0000 Encounter for general adult medical examination without abnormal findings: Secondary | ICD-10-CM | POA: Diagnosis not present

## 2018-03-30 DIAGNOSIS — C9110 Chronic lymphocytic leukemia of B-cell type not having achieved remission: Secondary | ICD-10-CM | POA: Diagnosis not present

## 2018-03-30 DIAGNOSIS — M810 Age-related osteoporosis without current pathological fracture: Secondary | ICD-10-CM | POA: Diagnosis not present

## 2018-03-30 DIAGNOSIS — E7800 Pure hypercholesterolemia, unspecified: Secondary | ICD-10-CM | POA: Diagnosis not present

## 2018-03-30 DIAGNOSIS — N390 Urinary tract infection, site not specified: Secondary | ICD-10-CM | POA: Diagnosis not present

## 2018-03-30 DIAGNOSIS — Z23 Encounter for immunization: Secondary | ICD-10-CM | POA: Diagnosis not present

## 2018-03-30 DIAGNOSIS — M8588 Other specified disorders of bone density and structure, other site: Secondary | ICD-10-CM | POA: Diagnosis not present

## 2018-04-10 ENCOUNTER — Other Ambulatory Visit: Payer: Self-pay | Admitting: Internal Medicine

## 2018-04-10 DIAGNOSIS — H539 Unspecified visual disturbance: Secondary | ICD-10-CM

## 2018-04-19 ENCOUNTER — Encounter: Payer: Self-pay | Admitting: Oncology

## 2018-04-20 ENCOUNTER — Telehealth: Payer: Self-pay | Admitting: *Deleted

## 2018-04-20 NOTE — Telephone Encounter (Signed)
Patient [place] a trip over seas and was recommended to take typhoid tablets.wanted to make sure it was okay with dr Alen Blew. Per dr Alen Blew, her immune system is intact and there are no concerns for her to take typhoid tablets. It is okay.

## 2018-04-26 ENCOUNTER — Ambulatory Visit
Admission: RE | Admit: 2018-04-26 | Discharge: 2018-04-26 | Disposition: A | Discharge status: Home / Self Care | Payer: Medicare Other | Source: Ambulatory Visit | Attending: Internal Medicine | Admitting: Internal Medicine

## 2018-04-26 ENCOUNTER — Other Ambulatory Visit: Payer: Medicare Other

## 2018-04-26 DIAGNOSIS — H539 Unspecified visual disturbance: Secondary | ICD-10-CM

## 2018-04-26 DIAGNOSIS — H538 Other visual disturbances: Secondary | ICD-10-CM | POA: Diagnosis not present

## 2018-04-26 MED ORDER — GADOBENATE DIMEGLUMINE 529 MG/ML IV SOLN
10.0000 mL | Freq: Once | INTRAVENOUS | Status: AC | PRN
  Administered 2018-04-26: 10 mL via INTRAVENOUS

## 2018-04-28 DIAGNOSIS — D485 Neoplasm of uncertain behavior of skin: Secondary | ICD-10-CM | POA: Diagnosis not present

## 2018-04-28 DIAGNOSIS — D0462 Carcinoma in situ of skin of left upper limb, including shoulder: Secondary | ICD-10-CM | POA: Diagnosis not present

## 2018-05-05 DIAGNOSIS — I10 Essential (primary) hypertension: Secondary | ICD-10-CM | POA: Diagnosis not present

## 2018-05-05 DIAGNOSIS — R3129 Other microscopic hematuria: Secondary | ICD-10-CM | POA: Diagnosis not present

## 2018-05-05 DIAGNOSIS — E7800 Pure hypercholesterolemia, unspecified: Secondary | ICD-10-CM | POA: Diagnosis not present

## 2018-05-06 DIAGNOSIS — D0462 Carcinoma in situ of skin of left upper limb, including shoulder: Secondary | ICD-10-CM | POA: Diagnosis not present

## 2018-05-06 DIAGNOSIS — H25813 Combined forms of age-related cataract, bilateral: Secondary | ICD-10-CM | POA: Diagnosis not present

## 2018-05-06 DIAGNOSIS — H52221 Regular astigmatism, right eye: Secondary | ICD-10-CM | POA: Diagnosis not present

## 2018-05-06 DIAGNOSIS — H5203 Hypermetropia, bilateral: Secondary | ICD-10-CM | POA: Diagnosis not present

## 2018-06-01 ENCOUNTER — Other Ambulatory Visit: Payer: Medicare Other

## 2018-06-18 DIAGNOSIS — Z85828 Personal history of other malignant neoplasm of skin: Secondary | ICD-10-CM | POA: Diagnosis not present

## 2018-06-18 DIAGNOSIS — L819 Disorder of pigmentation, unspecified: Secondary | ICD-10-CM | POA: Diagnosis not present

## 2018-06-18 DIAGNOSIS — D2271 Melanocytic nevi of right lower limb, including hip: Secondary | ICD-10-CM | POA: Diagnosis not present

## 2018-06-18 DIAGNOSIS — D2272 Melanocytic nevi of left lower limb, including hip: Secondary | ICD-10-CM | POA: Diagnosis not present

## 2018-06-18 DIAGNOSIS — L814 Other melanin hyperpigmentation: Secondary | ICD-10-CM | POA: Diagnosis not present

## 2018-06-18 DIAGNOSIS — D225 Melanocytic nevi of trunk: Secondary | ICD-10-CM | POA: Diagnosis not present

## 2018-06-18 DIAGNOSIS — L821 Other seborrheic keratosis: Secondary | ICD-10-CM | POA: Diagnosis not present

## 2018-06-18 DIAGNOSIS — D1801 Hemangioma of skin and subcutaneous tissue: Secondary | ICD-10-CM | POA: Diagnosis not present

## 2018-06-18 DIAGNOSIS — L570 Actinic keratosis: Secondary | ICD-10-CM | POA: Diagnosis not present

## 2018-08-17 DIAGNOSIS — J359 Chronic disease of tonsils and adenoids, unspecified: Secondary | ICD-10-CM | POA: Diagnosis not present

## 2018-11-03 ENCOUNTER — Inpatient Hospital Stay: Payer: Medicare Other | Attending: Oncology | Admitting: Oncology

## 2018-11-03 ENCOUNTER — Telehealth: Payer: Self-pay | Admitting: Oncology

## 2018-11-03 ENCOUNTER — Other Ambulatory Visit: Payer: Self-pay

## 2018-11-03 ENCOUNTER — Inpatient Hospital Stay: Payer: Medicare Other

## 2018-11-03 VITALS — BP 135/63 | HR 77 | Temp 98.0°F | Resp 17 | Ht 61.0 in | Wt 107.4 lb

## 2018-11-03 DIAGNOSIS — Z856 Personal history of leukemia: Secondary | ICD-10-CM | POA: Diagnosis not present

## 2018-11-03 DIAGNOSIS — D72820 Lymphocytosis (symptomatic): Secondary | ICD-10-CM

## 2018-11-03 DIAGNOSIS — Z881 Allergy status to other antibiotic agents status: Secondary | ICD-10-CM

## 2018-11-03 DIAGNOSIS — C9110 Chronic lymphocytic leukemia of B-cell type not having achieved remission: Secondary | ICD-10-CM

## 2018-11-03 LAB — CMP (CANCER CENTER ONLY)
ALT: 18 U/L (ref 0–44)
AST: 17 U/L (ref 15–41)
Albumin: 4.1 g/dL (ref 3.5–5.0)
Alkaline Phosphatase: 78 U/L (ref 38–126)
Anion gap: 9 (ref 5–15)
BUN: 17 mg/dL (ref 8–23)
CO2: 27 mmol/L (ref 22–32)
Calcium: 10.5 mg/dL — ABNORMAL HIGH (ref 8.9–10.3)
Chloride: 105 mmol/L (ref 98–111)
Creatinine: 0.83 mg/dL (ref 0.44–1.00)
GFR, Est AFR Am: >60 mL/min (ref >60)
GFR, Estimated: >60 mL/min (ref >60)
Glucose, Bld: 106 mg/dL — ABNORMAL HIGH (ref 70–99)
Potassium: 4.5 mmol/L (ref 3.5–5.1)
Sodium: 141 mmol/L (ref 135–145)
Total Bilirubin: 1.1 mg/dL (ref 0.3–1.2)
Total Protein: 7.3 g/dL (ref 6.5–8.1)

## 2018-11-03 LAB — CBC WITH DIFFERENTIAL (CANCER CENTER ONLY)
Abs Immature Granulocytes: 0.02 10*3/uL (ref 0.00–0.07)
Basophils Absolute: 0 10*3/uL (ref 0.0–0.1)
Basophils Relative: 0 %
Eosinophils Absolute: 0 10*3/uL (ref 0.0–0.5)
Eosinophils Relative: 0 %
HCT: 44.1 % (ref 36.0–46.0)
Hemoglobin: 14.1 g/dL (ref 12.0–15.0)
Immature Granulocytes: 0 %
Lymphocytes Relative: 88 %
Lymphs Abs: 21.4 10*3/uL — ABNORMAL HIGH (ref 0.7–4.0)
MCH: 29.6 pg (ref 26.0–34.0)
MCHC: 32 g/dL (ref 30.0–36.0)
MCV: 92.5 fL (ref 80.0–100.0)
Monocytes Absolute: 0.5 10*3/uL (ref 0.1–1.0)
Monocytes Relative: 2 %
Neutro Abs: 2.4 10*3/uL (ref 1.7–7.7)
Neutrophils Relative %: 10 %
Platelet Count: 264 10*3/uL (ref 150–400)
RBC: 4.77 MIL/uL (ref 3.87–5.11)
RDW: 12.4 % (ref 11.5–15.5)
WBC Count: 24.4 10*3/uL — ABNORMAL HIGH (ref 4.0–10.5)
nRBC: 0 % (ref 0.0–0.2)

## 2018-11-03 LAB — LACTATE DEHYDROGENASE: LDH: 119 U/L (ref 98–192)

## 2018-11-03 NOTE — Progress Notes (Signed)
Hematology and Oncology Follow Up Visit  Wendy Hamilton [id number] [birth year] 73 y.o. 11/03/2018 9:17 AM [name], MDGriffin, [name], MD   Principle Diagnosis: 73 year old woman with stage 0 chronic lymphocytic leukemia (CLL) diagnosed in September 2016.  She was found to have lymphocytosis and no lymphadenopathy.      Current therapy: Active surveillance.  Interim History: Ms. Coriz returns today for a repeat evaluation.  Since her last visit,  Patient denied any alteration mental status, neuropathy, confusion or dizziness.  Denies any headaches or lethargy.  Denies any night sweats, weight loss or changes in appetite.  Denied orthopnea, dyspnea on exertion or chest discomfort.  Denies shortness of breath, difficulty breathing hemoptysis or cough.  Denies any abdominal distention, nausea, early satiety or dyspepsia.  Denies any hematuria, frequency, dysuria or nocturia.  Denies any skin irritation, dryness or rash.  Denies any ecchymosis or petechiae.  Denies any lymphadenopathy or clotting.  Denies any heat or cold intolerance.  Denies any anxiety or depression.  Remaining review of system is negative.     Medications: I have reviewed the patient's current medications.  She does not take any new medication.   Allergies:  Allergies  Allergen Reactions  . Nitrofurantoin Rash and Other (See Comments)    Joint pain    Past Medical History, Surgical history, Social history, and Family History reviewed again and remain unchanged.   Physical Exam:  Blood pressure 135/63, pulse 77, temperature 98 F (36.7 C), temperature source Oral, resp. rate 17, height 5\' 1"  (1.549 m), weight 107 lb 6.4 oz (48.7 kg), SpO2 100 %.     ECOG: 0   General appearance: Alert, awake without any distress. Head: Atraumatic without abnormalities Oropharynx: Without any thrush or ulcers. Eyes: No scleral icterus. Lymph nodes: No lymphadenopathy noted in the cervical, supraclavicular, or axillary  nodes Heart:regular rate and rhythm, without any murmurs or gallops.   Lung: Clear to auscultation without any rhonchi, wheezes or dullness to percussion. Abdomin: Soft, nontender without any shifting dullness or ascites. Musculoskeletal: No clubbing or cyanosis. Neurological: No motor or sensory deficits. Skin: No rashes or lesions.     Lab Results: Lab Results  Component Value Date   WBC 24.4 (H) 11/03/2018   HGB 14.1 11/03/2018   HCT 44.1 11/03/2018   MCV 92.5 11/03/2018   PLT 264 11/03/2018     Chemistry      Component Value Date/Time   NA 141 03/03/2018 0756   NA 142 12/09/2016 0839   K 4.9 03/03/2018 0756   K 4.8 12/09/2016 0839   CL 103 03/03/2018 0756   CO2 27 03/03/2018 0756   CO2 27 12/09/2016 0839   BUN 21 03/03/2018 0756   BUN 16.8 12/09/2016 0839   CREATININE 0.80 03/03/2018 0756   CREATININE 0.8 12/09/2016 0839      Component Value Date/Time   CALCIUM 10.9 (H) 03/03/2018 0756   CALCIUM 10.8 (H) 12/09/2016 0839   ALKPHOS 88 03/03/2018 0756   ALKPHOS 80 12/09/2016 0839   AST 18 03/03/2018 0756   AST 17 12/09/2016 0839   ALT 17 03/03/2018 0756   ALT 14 12/09/2016 0839   BILITOT 1.1 03/03/2018 0756   BILITOT 1.29 (H) 12/09/2016 0839       Impression and Plan:   73 year old woman with the following issues:  1.  CLL diagnosed in September 2016.  She presented with lymphocytosis without lymphadenopathy.    She remains on active surveillance without any indication for treatment.  Laboratory data were  reviewed today including a CBC and lymphocytosis which has not changed dramatically over the last few years.  Risks and benefits of continuing active surveillance was discussed today versus active treatment.  At this time I see no indication for treatment I will continue active surveillance.  I continue to educate her about the possibility of needing active treatment in the future if needed.  She is agreeable with this plan.  2. Age-appropriate cancer  screening: She continues to be up-to-date.  3. Follow-up: In a months for repeat evaluation.  15 minutes was spent with her face-to-face today.  More than 50% of time was spent on reviewing disease status update, laboratory data review and answering questions regarding future plan of care.   [name], MD 5/20/20209:17 AM

## 2018-11-03 NOTE — Telephone Encounter (Signed)
Left message re January 2021 appointments. Schedule mailed. Appointments scheduled per 5/20 schedule message. Per FS appointments should be January 2021.

## 2018-11-16 DIAGNOSIS — E7800 Pure hypercholesterolemia, unspecified: Secondary | ICD-10-CM | POA: Diagnosis not present

## 2018-11-19 DIAGNOSIS — I10 Essential (primary) hypertension: Secondary | ICD-10-CM | POA: Diagnosis not present

## 2018-11-19 DIAGNOSIS — E7800 Pure hypercholesterolemia, unspecified: Secondary | ICD-10-CM | POA: Diagnosis not present

## 2018-11-26 ENCOUNTER — Other Ambulatory Visit: Payer: Self-pay | Admitting: Internal Medicine

## 2018-11-26 DIAGNOSIS — I10 Essential (primary) hypertension: Secondary | ICD-10-CM

## 2018-12-09 DIAGNOSIS — H52221 Regular astigmatism, right eye: Secondary | ICD-10-CM | POA: Diagnosis not present

## 2018-12-09 DIAGNOSIS — H5203 Hypermetropia, bilateral: Secondary | ICD-10-CM | POA: Diagnosis not present

## 2018-12-09 DIAGNOSIS — H04123 Dry eye syndrome of bilateral lacrimal glands: Secondary | ICD-10-CM | POA: Diagnosis not present

## 2018-12-09 DIAGNOSIS — H524 Presbyopia: Secondary | ICD-10-CM | POA: Diagnosis not present

## 2018-12-27 ENCOUNTER — Ambulatory Visit
Admission: RE | Admit: 2018-12-27 | Discharge: 2018-12-27 | Disposition: A | Discharge status: Home / Self Care | Payer: Medicare Other | Source: Ambulatory Visit | Attending: Internal Medicine | Admitting: Internal Medicine

## 2018-12-27 DIAGNOSIS — I10 Essential (primary) hypertension: Secondary | ICD-10-CM

## 2019-04-01 DIAGNOSIS — N390 Urinary tract infection, site not specified: Secondary | ICD-10-CM | POA: Diagnosis not present

## 2019-04-01 DIAGNOSIS — Z87440 Personal history of urinary (tract) infections: Secondary | ICD-10-CM | POA: Diagnosis not present

## 2019-04-01 DIAGNOSIS — R3129 Other microscopic hematuria: Secondary | ICD-10-CM | POA: Diagnosis not present

## 2019-04-05 DIAGNOSIS — N390 Urinary tract infection, site not specified: Secondary | ICD-10-CM | POA: Diagnosis not present

## 2019-04-15 DIAGNOSIS — Z0000 Encounter for general adult medical examination without abnormal findings: Secondary | ICD-10-CM | POA: Diagnosis not present

## 2019-04-15 DIAGNOSIS — Z23 Encounter for immunization: Secondary | ICD-10-CM | POA: Diagnosis not present

## 2019-04-15 DIAGNOSIS — Z1389 Encounter for screening for other disorder: Secondary | ICD-10-CM | POA: Diagnosis not present

## 2019-04-15 DIAGNOSIS — E7800 Pure hypercholesterolemia, unspecified: Secondary | ICD-10-CM | POA: Diagnosis not present

## 2019-04-15 DIAGNOSIS — I10 Essential (primary) hypertension: Secondary | ICD-10-CM | POA: Diagnosis not present

## 2019-04-15 DIAGNOSIS — M810 Age-related osteoporosis without current pathological fracture: Secondary | ICD-10-CM | POA: Diagnosis not present

## 2019-04-18 DIAGNOSIS — N390 Urinary tract infection, site not specified: Secondary | ICD-10-CM | POA: Diagnosis not present

## 2019-04-18 DIAGNOSIS — R3121 Asymptomatic microscopic hematuria: Secondary | ICD-10-CM | POA: Diagnosis not present

## 2019-06-21 ENCOUNTER — Inpatient Hospital Stay: Payer: Medicare Other

## 2019-06-21 ENCOUNTER — Other Ambulatory Visit: Payer: Self-pay

## 2019-06-21 ENCOUNTER — Inpatient Hospital Stay: Payer: Medicare Other | Attending: Oncology | Admitting: Oncology

## 2019-06-21 VITALS — BP 129/58 | HR 78 | Temp 97.8°F | Resp 17 | Ht 61.0 in | Wt 109.0 lb

## 2019-06-21 DIAGNOSIS — C9110 Chronic lymphocytic leukemia of B-cell type not having achieved remission: Secondary | ICD-10-CM

## 2019-06-21 LAB — CBC WITH DIFFERENTIAL (CANCER CENTER ONLY)
Abs Immature Granulocytes: 0.03 10*3/uL (ref 0.00–0.07)
Basophils Absolute: 0.1 10*3/uL (ref 0.0–0.1)
Basophils Relative: 0 %
Eosinophils Absolute: 0 10*3/uL (ref 0.0–0.5)
Eosinophils Relative: 0 %
HCT: 41.8 % (ref 36.0–46.0)
Hemoglobin: 13.4 g/dL (ref 12.0–15.0)
Immature Granulocytes: 0 %
Lymphocytes Relative: 82 %
Lymphs Abs: 22.3 10*3/uL — ABNORMAL HIGH (ref 0.7–4.0)
MCH: 29.6 pg (ref 26.0–34.0)
MCHC: 32.1 g/dL (ref 30.0–36.0)
MCV: 92.3 fL (ref 80.0–100.0)
Monocytes Absolute: 0.4 10*3/uL (ref 0.1–1.0)
Monocytes Relative: 2 %
Neutro Abs: 4.5 10*3/uL (ref 1.7–7.7)
Neutrophils Relative %: 16 %
Platelet Count: 286 10*3/uL (ref 150–400)
RBC: 4.53 MIL/uL (ref 3.87–5.11)
RDW: 12.6 % (ref 11.5–15.5)
WBC Count: 27.4 10*3/uL — ABNORMAL HIGH (ref 4.0–10.5)
nRBC: 0 % (ref 0.0–0.2)

## 2019-06-21 LAB — CMP (CANCER CENTER ONLY)
ALT: 20 U/L (ref 0–44)
AST: 19 U/L (ref 15–41)
Albumin: 4.1 g/dL (ref 3.5–5.0)
Alkaline Phosphatase: 76 U/L (ref 38–126)
Anion gap: 8 (ref 5–15)
BUN: 26 mg/dL — ABNORMAL HIGH (ref 8–23)
CO2: 27 mmol/L (ref 22–32)
Calcium: 10.1 mg/dL (ref 8.9–10.3)
Chloride: 105 mmol/L (ref 98–111)
Creatinine: 0.75 mg/dL (ref 0.44–1.00)
GFR, Est AFR Am: >60 mL/min (ref >60)
GFR, Estimated: >60 mL/min (ref >60)
Glucose, Bld: 182 mg/dL — ABNORMAL HIGH (ref 70–99)
Potassium: 4.7 mmol/L (ref 3.5–5.1)
Sodium: 140 mmol/L (ref 135–145)
Total Bilirubin: 1 mg/dL (ref 0.3–1.2)
Total Protein: 6.7 g/dL (ref 6.5–8.1)

## 2019-06-21 LAB — LACTATE DEHYDROGENASE: LDH: 137 U/L (ref 98–192)

## 2019-06-21 NOTE — Progress Notes (Signed)
Hematology and Oncology Follow Up Visit  Naleyah Bezio [id number] 07-06-1945 74 y.o. 06/21/2019 8:17 AM [name], MDGriffin, [name], MD   Principle Diagnosis: 74 year old woman with CLL presented with lymphocytosis in September 2016.  She was found to have stage 0 at that time without any indication for treatment.   Current therapy: Active surveillance.  Interim History: Ms. Lemmon presents today for a follow-up visit.  She reports feeling reasonably well without any changes since the last visit.  She reports no major changes in her health.  She continues to feel well and continues to attend to activities of daily living.  She denies any nausea, vomiting or abdominal pain.  She denies any lymphadenopathy or petechiae.  She denies any easy bruising or constitutional symptoms.      Medications: She takes none at this time.   Allergies:  Allergies  Allergen Reactions  . Nitrofurantoin Rash and Other (See Comments)    Joint pain    Past Medical History, Surgical history, Social history, and Family History without any changes on review.   Physical Exam:   Blood pressure (!) 129/58, pulse 78, temperature 97.8 F (36.6 C), temperature source Temporal, resp. rate 17, height 5\' 1"  (1.549 m), weight 109 lb (49.4 kg), SpO2 100 %.     ECOG: 0     General appearance: Comfortable appearing without any discomfort Head: Normocephalic without any trauma Oropharynx: Mucous membranes are moist and pink without any thrush or ulcers. Eyes: Pupils are equal and round reactive to light. Lymph nodes: No cervical, supraclavicular, inguinal or axillary lymphadenopathy.   Heart:regular rate and rhythm.  S1 and S2 without leg edema. Lung: Clear without any rhonchi or wheezes.  No dullness to percussion. Abdomin: Soft, nontender, nondistended with good bowel sounds.  No hepatosplenomegaly. Musculoskeletal: No joint deformity or effusion.  Full range of motion noted. Neurological: No  deficits noted on motor, sensory and deep tendon reflex exam. Skin: No petechial rash or dryness.  Appeared moist.       Lab Results: Lab Results  Component Value Date   WBC 24.4 (H) 11/03/2018   HGB 14.1 11/03/2018   HCT 44.1 11/03/2018   MCV 92.5 11/03/2018   PLT 264 11/03/2018     Chemistry      Component Value Date/Time   NA 141 11/03/2018 0853   NA 142 12/09/2016 0839   K 4.5 11/03/2018 0853   K 4.8 12/09/2016 0839   CL 105 11/03/2018 0853   CO2 27 11/03/2018 0853   CO2 27 12/09/2016 0839   BUN 17 11/03/2018 0853   BUN 16.8 12/09/2016 0839   CREATININE 0.83 11/03/2018 0853   CREATININE 0.8 12/09/2016 0839      Component Value Date/Time   CALCIUM 10.5 (H) 11/03/2018 0853   CALCIUM 10.8 (H) 12/09/2016 0839   ALKPHOS 78 11/03/2018 0853   ALKPHOS 80 12/09/2016 0839   AST 17 11/03/2018 0853   AST 17 12/09/2016 0839   ALT 18 11/03/2018 0853   ALT 14 12/09/2016 0839   BILITOT 1.1 11/03/2018 0853   BILITOT 1.29 (H) 12/09/2016 0839       Impression and Plan:   74 year old woman with:   1.  CLL presented with stage 0 lymphocytosis detected on peripheral blood flow cytometry in 2016.   The natural course of her disease was reviewed again and treatment indication were discussed.  She remains on active surveillance at this time without any need for treatment since her diagnosis.  Indication for treatment that include  a rapid rise in her white cell count, bone marrow failure, autoimmune considerations, painful adenopathy among others were reviewed.  Treatment options include systemic chemotherapy and oral targeted therapy.    CBC reviewed today and showed a very mild increase in her white cell count without any indication to treat.  2. Age-appropriate cancer screening: No issues reported since the last time when she remains up-to-date.  3. Follow-up: In 6 months for a repeat evaluation.  15 minutes was spent on patient care today.  More than 50% of time was spent  on reviewing her disease status, discussing laboratory data, treatment indications and future plan of care.  [name], MD 1/5/20218:17 AM

## 2019-06-22 ENCOUNTER — Telehealth: Payer: Self-pay | Admitting: Oncology

## 2019-06-22 NOTE — Telephone Encounter (Signed)
Scheduled appt per 1/5 los, sent a message to HIM pool to get a calendar mailed out. 

## 2019-06-27 ENCOUNTER — Ambulatory Visit: Payer: Medicare Other | Attending: Internal Medicine

## 2019-06-27 DIAGNOSIS — Z23 Encounter for immunization: Secondary | ICD-10-CM | POA: Insufficient documentation

## 2019-06-27 NOTE — Progress Notes (Signed)
   Covid-19 Vaccination Clinic  Name:  Wendy Hamilton    MRN: [id number] DOB: Aug 22, 1945  06/27/2019  Wendy Hamilton was observed post Covid-19 immunization for 15 minutes without incidence. She was provided with Vaccine Information Sheet and instruction to access the V-Safe system.   Wendy Hamilton was instructed to call 911 with any severe reactions post vaccine: Marland Kitchen Difficulty breathing  . Swelling of your face and throat  . A fast heartbeat  . A bad rash all over your body  . Dizziness and weakness    Immunizations Administered    Name Date Dose VIS Date Route   Pfizer COVID-19 Vaccine 06/27/2019 10:42 AM 0.3 mL 05/27/2019 Intramuscular   Manufacturer: [employer], [employer]   Lot: [id number]   [place]: [id number]

## 2019-07-17 ENCOUNTER — Ambulatory Visit: Payer: Medicare Other | Attending: Internal Medicine

## 2019-07-17 DIAGNOSIS — Z23 Encounter for immunization: Secondary | ICD-10-CM | POA: Insufficient documentation

## 2019-07-17 NOTE — Progress Notes (Signed)
   Covid-19 Vaccination Clinic  Name:  Wendy Hamilton    MRN: [id number] DOB: 1945/06/26  07/17/2019  [name] was observed post Covid-19 immunization for 15 minutes without incidence. She was provided with Vaccine Information Sheet and instruction to access the V-Safe system.   [name] was instructed to call 911 with any severe reactions post vaccine: Marland Kitchen Difficulty breathing  . Swelling of your face and throat  . A fast heartbeat  . A bad rash all over your body  . Dizziness and weakness    Immunizations Administered    Name Date Dose VIS Date Route   Pfizer COVID-19 Vaccine 07/17/2019 11:09 AM 0.3 mL 05/27/2019 Intramuscular   Manufacturer: [place]   Lot: [id number]   [place]: [id number]

## 2019-08-17 DIAGNOSIS — L819 Disorder of pigmentation, unspecified: Secondary | ICD-10-CM | POA: Diagnosis not present

## 2019-08-17 DIAGNOSIS — L578 Other skin changes due to chronic exposure to nonionizing radiation: Secondary | ICD-10-CM | POA: Diagnosis not present

## 2019-08-17 DIAGNOSIS — D225 Melanocytic nevi of trunk: Secondary | ICD-10-CM | POA: Diagnosis not present

## 2019-08-17 DIAGNOSIS — D2272 Melanocytic nevi of left lower limb, including hip: Secondary | ICD-10-CM | POA: Diagnosis not present

## 2019-08-17 DIAGNOSIS — D1801 Hemangioma of skin and subcutaneous tissue: Secondary | ICD-10-CM | POA: Diagnosis not present

## 2019-08-17 DIAGNOSIS — L821 Other seborrheic keratosis: Secondary | ICD-10-CM | POA: Diagnosis not present

## 2019-08-17 DIAGNOSIS — I788 Other diseases of capillaries: Secondary | ICD-10-CM | POA: Diagnosis not present

## 2019-08-17 DIAGNOSIS — D2271 Melanocytic nevi of right lower limb, including hip: Secondary | ICD-10-CM | POA: Diagnosis not present

## 2019-12-16 ENCOUNTER — Telehealth: Payer: Self-pay | Admitting: Oncology

## 2019-12-16 NOTE — Telephone Encounter (Signed)
Rescheduled per 07/06 due to providers pal, patient has been notified of new date.

## 2019-12-20 ENCOUNTER — Other Ambulatory Visit: Payer: Medicare Other

## 2019-12-20 ENCOUNTER — Ambulatory Visit: Payer: Medicare Other | Admitting: Oncology

## 2019-12-29 ENCOUNTER — Other Ambulatory Visit: Payer: Medicare Other

## 2019-12-29 ENCOUNTER — Ambulatory Visit: Payer: Medicare Other | Admitting: Oncology

## 2020-01-04 ENCOUNTER — Other Ambulatory Visit: Payer: Self-pay | Admitting: Oncology

## 2020-01-04 DIAGNOSIS — C9110 Chronic lymphocytic leukemia of B-cell type not having achieved remission: Secondary | ICD-10-CM

## 2020-01-05 ENCOUNTER — Inpatient Hospital Stay (HOSPITAL_BASED_OUTPATIENT_CLINIC_OR_DEPARTMENT_OTHER): Payer: Medicare Other | Admitting: Oncology

## 2020-01-05 ENCOUNTER — Inpatient Hospital Stay: Payer: Medicare Other | Attending: Oncology

## 2020-01-05 ENCOUNTER — Other Ambulatory Visit: Payer: Self-pay

## 2020-01-05 VITALS — BP 150/68 | HR 78 | Temp 97.3°F | Resp 18 | Ht 61.0 in | Wt 109.2 lb

## 2020-01-05 DIAGNOSIS — C9110 Chronic lymphocytic leukemia of B-cell type not having achieved remission: Secondary | ICD-10-CM | POA: Diagnosis not present

## 2020-01-05 LAB — CMP (CANCER CENTER ONLY)
ALT: 17 U/L (ref 0–44)
AST: 17 U/L (ref 15–41)
Albumin: 4 g/dL (ref 3.5–5.0)
Alkaline Phosphatase: 75 U/L (ref 38–126)
Anion gap: 9 (ref 5–15)
BUN: 17 mg/dL (ref 8–23)
CO2: 24 mmol/L (ref 22–32)
Calcium: 10.6 mg/dL — ABNORMAL HIGH (ref 8.9–10.3)
Chloride: 105 mmol/L (ref 98–111)
Creatinine: 0.8 mg/dL (ref 0.44–1.00)
GFR, Est AFR Am: >60 mL/min (ref >60)
GFR, Estimated: >60 mL/min (ref >60)
Glucose, Bld: 165 mg/dL — ABNORMAL HIGH (ref 70–99)
Potassium: 4.4 mmol/L (ref 3.5–5.1)
Sodium: 138 mmol/L (ref 135–145)
Total Bilirubin: 0.9 mg/dL (ref 0.3–1.2)
Total Protein: 6.9 g/dL (ref 6.5–8.1)

## 2020-01-05 LAB — CBC WITH DIFFERENTIAL (CANCER CENTER ONLY)
Abs Immature Granulocytes: 0.02 10*3/uL (ref 0.00–0.07)
Basophils Absolute: 0 10*3/uL (ref 0.0–0.1)
Basophils Relative: 0 %
Eosinophils Absolute: 0 10*3/uL (ref 0.0–0.5)
Eosinophils Relative: 0 %
HCT: 43.1 % (ref 36.0–46.0)
Hemoglobin: 14.1 g/dL (ref 12.0–15.0)
Immature Granulocytes: 0 %
Lymphocytes Relative: 83 %
Lymphs Abs: 21.7 10*3/uL — ABNORMAL HIGH (ref 0.7–4.0)
MCH: 29.8 pg (ref 26.0–34.0)
MCHC: 32.7 g/dL (ref 30.0–36.0)
MCV: 91.1 fL (ref 80.0–100.0)
Monocytes Absolute: 1.6 10*3/uL — ABNORMAL HIGH (ref 0.1–1.0)
Monocytes Relative: 6 %
Neutro Abs: 2.9 10*3/uL (ref 1.7–7.7)
Neutrophils Relative %: 11 %
Platelet Count: 269 10*3/uL (ref 150–400)
RBC: 4.73 MIL/uL (ref 3.87–5.11)
RDW: 13 % (ref 11.5–15.5)
WBC Count: 26.3 10*3/uL — ABNORMAL HIGH (ref 4.0–10.5)
nRBC: 0 % (ref 0.0–0.2)

## 2020-01-05 NOTE — Progress Notes (Signed)
Hematology and Oncology Follow Up Visit  Wendy Hamilton [id number] 1946/01/17 74 y.o. 01/05/2020 8:59 AM [name], MDGriffin, [name], MD   Principle Diagnosis: 74 year old woman with stage 0 CLL diagnosed in 2016 after she was found to have lymphocytosis without any lymphadenopathy.    Current therapy: Active surveillance.  Interim History: Ms. Wendy Hamilton returns today for a repeat evaluation.  Since the last visit, she reports feeling well without any major complaints.  She denies any nausea, vomiting or abdominal pain.  She denies any early satiety or constitutional symptoms.  She denies any lymphadenopathy or petechiae.  Her weight and activity level remained excellent.      Medications: Medication reviewed today and she is not taking any.   Allergies:  Allergies  Allergen Reactions  . Nitrofurantoin Rash and Other (See Comments)    Joint pain       Physical Exam:   Blood pressure (!) 150/68, pulse 78, temperature (!) 97.3 F (36.3 C), temperature source Temporal, resp. rate 18, height 5\' 1"  (1.549 m), weight 109 lb 3.2 oz (49.5 kg), SpO2 100 %.     ECOG: 0    General appearance: Alert, awake without any distress. Head: Atraumatic without abnormalities Oropharynx: Without any thrush or ulcers. Eyes: No scleral icterus. Lymph nodes: No lymphadenopathy noted in the cervical, supraclavicular, or axillary nodes Heart:regular rate and rhythm, without any murmurs or gallops.   Lung: Clear to auscultation without any rhonchi, wheezes or dullness to percussion. Abdomin: Soft, nontender without any shifting dullness or ascites. Musculoskeletal: No clubbing or cyanosis. Neurological: No motor or sensory deficits. Skin: No rashes or lesions.      Lab Results: Lab Results  Component Value Date   WBC 26.3 (H) 01/05/2020   HGB 14.1 01/05/2020   HCT 43.1 01/05/2020   MCV 91.1 01/05/2020   PLT 269 01/05/2020     Chemistry      Component Value Date/Time   NA 140  06/21/2019 0806   NA 142 12/09/2016 0839   K 4.7 06/21/2019 0806   K 4.8 12/09/2016 0839   CL 105 06/21/2019 0806   CO2 27 06/21/2019 0806   CO2 27 12/09/2016 0839   BUN 26 (H) 06/21/2019 0806   BUN 16.8 12/09/2016 0839   CREATININE 0.75 06/21/2019 0806   CREATININE 0.8 12/09/2016 0839      Component Value Date/Time   CALCIUM 10.1 06/21/2019 0806   CALCIUM 10.8 (H) 12/09/2016 0839   ALKPHOS 76 06/21/2019 0806   ALKPHOS 80 12/09/2016 0839   AST 19 06/21/2019 0806   AST 17 12/09/2016 0839   ALT 20 06/21/2019 0806   ALT 14 12/09/2016 0839   BILITOT 1.0 06/21/2019 0806   BILITOT 1.29 (H) 12/09/2016 0839       Impression and Plan:   74 year old woman with:   1.  Stage 0 CLL diagnosed in 2016 with lymphocytosis.    She has been on active surveillance without any indication for treatment at this time.  The natural course of this disease was reiterated today and treatment options were discussed.  At this time I recommended continued active surveillance given the fact that she does not have any specific symptoms or indication for treatment.  Laboratory data from today continues to show lymphocytosis that has not dramatically different at this time.  .  2.  Covid vaccination considerations: She is up-to-date on her vaccination.  Her CLL could put her at increased risk for opportunistic infections but her risk is low at this time.  3. Follow-up: She will return in 6 months for a follow-up visit.  30  minutes were dedicated to this encounter.  Time was spent on reviewing laboratory data, disease status update as well as future implication because of this condition.  [name], MD 7/22/20218:59 AM

## 2020-01-11 ENCOUNTER — Telehealth: Payer: Self-pay | Admitting: Oncology

## 2020-01-11 NOTE — Telephone Encounter (Signed)
Scheduled per 07/22 los, patient has been called and notified. 

## 2020-01-18 DIAGNOSIS — H52221 Regular astigmatism, right eye: Secondary | ICD-10-CM | POA: Diagnosis not present

## 2020-01-18 DIAGNOSIS — H2513 Age-related nuclear cataract, bilateral: Secondary | ICD-10-CM | POA: Diagnosis not present

## 2020-01-18 DIAGNOSIS — H5203 Hypermetropia, bilateral: Secondary | ICD-10-CM | POA: Diagnosis not present

## 2020-01-18 DIAGNOSIS — H524 Presbyopia: Secondary | ICD-10-CM | POA: Diagnosis not present

## 2020-03-19 DIAGNOSIS — Z23 Encounter for immunization: Secondary | ICD-10-CM | POA: Diagnosis not present

## 2020-04-24 DIAGNOSIS — Z23 Encounter for immunization: Secondary | ICD-10-CM | POA: Diagnosis not present

## 2020-04-24 DIAGNOSIS — E042 Nontoxic multinodular goiter: Secondary | ICD-10-CM | POA: Diagnosis not present

## 2020-04-24 DIAGNOSIS — Z1389 Encounter for screening for other disorder: Secondary | ICD-10-CM | POA: Diagnosis not present

## 2020-04-24 DIAGNOSIS — C9110 Chronic lymphocytic leukemia of B-cell type not having achieved remission: Secondary | ICD-10-CM | POA: Diagnosis not present

## 2020-04-24 DIAGNOSIS — Z0000 Encounter for general adult medical examination without abnormal findings: Secondary | ICD-10-CM | POA: Diagnosis not present

## 2020-04-24 DIAGNOSIS — E7800 Pure hypercholesterolemia, unspecified: Secondary | ICD-10-CM | POA: Diagnosis not present

## 2020-04-24 DIAGNOSIS — I10 Essential (primary) hypertension: Secondary | ICD-10-CM | POA: Diagnosis not present

## 2020-04-24 DIAGNOSIS — M810 Age-related osteoporosis without current pathological fracture: Secondary | ICD-10-CM | POA: Diagnosis not present

## 2020-05-21 ENCOUNTER — Other Ambulatory Visit: Payer: Self-pay | Admitting: Internal Medicine

## 2020-05-21 DIAGNOSIS — Z13820 Encounter for screening for osteoporosis: Secondary | ICD-10-CM

## 2020-07-05 ENCOUNTER — Inpatient Hospital Stay (HOSPITAL_BASED_OUTPATIENT_CLINIC_OR_DEPARTMENT_OTHER): Payer: Medicare Other | Admitting: Oncology

## 2020-07-05 ENCOUNTER — Inpatient Hospital Stay: Payer: Medicare Other | Attending: Oncology

## 2020-07-05 ENCOUNTER — Other Ambulatory Visit: Payer: Self-pay

## 2020-07-05 ENCOUNTER — Ambulatory Visit: Payer: Medicare Other | Admitting: Oncology

## 2020-07-05 ENCOUNTER — Other Ambulatory Visit: Payer: Medicare Other

## 2020-07-05 VITALS — BP 137/69 | HR 60 | Temp 98.1°F | Resp 18 | Ht 61.0 in | Wt 109.1 lb

## 2020-07-05 DIAGNOSIS — C9110 Chronic lymphocytic leukemia of B-cell type not having achieved remission: Secondary | ICD-10-CM

## 2020-07-05 LAB — CBC WITH DIFFERENTIAL (CANCER CENTER ONLY)
Abs Immature Granulocytes: 0.01 10*3/uL (ref 0.00–0.07)
Basophils Absolute: 0.1 10*3/uL (ref 0.0–0.1)
Basophils Relative: 0 %
Eosinophils Absolute: 0.1 10*3/uL (ref 0.0–0.5)
Eosinophils Relative: 0 %
HCT: 41.3 % (ref 36.0–46.0)
Hemoglobin: 13 g/dL (ref 12.0–15.0)
Immature Granulocytes: 0 %
Lymphocytes Relative: 89 %
Lymphs Abs: 23.3 10*3/uL — ABNORMAL HIGH (ref 0.7–4.0)
MCH: 28.9 pg (ref 26.0–34.0)
MCHC: 31.5 g/dL (ref 30.0–36.0)
MCV: 91.8 fL (ref 80.0–100.0)
Monocytes Absolute: 0.5 10*3/uL (ref 0.1–1.0)
Monocytes Relative: 2 %
Neutro Abs: 2.2 10*3/uL (ref 1.7–7.7)
Neutrophils Relative %: 9 %
Platelet Count: 241 10*3/uL (ref 150–400)
RBC: 4.5 MIL/uL (ref 3.87–5.11)
RDW: 12.7 % (ref 11.5–15.5)
WBC Count: 26.1 10*3/uL — ABNORMAL HIGH (ref 4.0–10.5)
nRBC: 0.1 % (ref 0.0–0.2)

## 2020-07-05 LAB — CMP (CANCER CENTER ONLY)
ALT: 15 U/L (ref 0–44)
AST: 18 U/L (ref 15–41)
Albumin: 4 g/dL (ref 3.5–5.0)
Alkaline Phosphatase: 69 U/L (ref 38–126)
Anion gap: 9 (ref 5–15)
BUN: 20 mg/dL (ref 8–23)
CO2: 25 mmol/L (ref 22–32)
Calcium: 9.8 mg/dL (ref 8.9–10.3)
Chloride: 107 mmol/L (ref 98–111)
Creatinine: 0.72 mg/dL (ref 0.44–1.00)
GFR, Estimated: >60 mL/min (ref >60)
Glucose, Bld: 98 mg/dL (ref 70–99)
Potassium: 4.6 mmol/L (ref 3.5–5.1)
Sodium: 141 mmol/L (ref 135–145)
Total Bilirubin: 0.9 mg/dL (ref 0.3–1.2)
Total Protein: 6.8 g/dL (ref 6.5–8.1)

## 2020-07-05 NOTE — Progress Notes (Addendum)
Hematology and Oncology Follow Up Visit  Wendy Hamilton [id number] 01-Sep-1945 75 y.o. 07/05/2020 8:53 AM Wendy Hamilton, MDGriffin, Wendy Reichmann, MD   Principle Diagnosis: 75 year old woman with CLL presented with lymphocytosis and no adenopathy indicating stage 0 diagnosed in 2016.     Current therapy: Active surveillance.  Interim History: Wendy Hamilton is here for a follow-up visit.  Since the last visit, she reports no major changes in her health.  She denies any recent hospitalization or illnesses.  She denies any lymphadenopathy or petechiae.  She denies any constitutional symptoms or any hospitalizations.      Medications: She is currently on Norvasc but no other medications.   Allergies:  Allergies  Allergen Reactions  . Nitrofurantoin Rash and Other (See Comments)    Joint pain       Physical Exam:   Blood pressure 137/69, pulse 60, temperature 98.1 F (36.7 C), temperature source Tympanic, resp. rate 18, height 5\' 1"  (1.549 m), weight 109 lb 1.6 oz (49.5 kg), SpO2 100 %.      ECOG: 0   General appearance: Comfortable appearing without any discomfort Head: Normocephalic without any trauma Oropharynx: Mucous membranes are moist and pink without any thrush or ulcers. Eyes: Pupils are equal and round reactive to light. Lymph nodes: No cervical, supraclavicular, inguinal or axillary lymphadenopathy.   Heart:regular rate and rhythm.  S1 and S2 without leg edema. Lung: Clear without any rhonchi or wheezes.  No dullness to percussion. Abdomin: Soft, nontender, nondistended with good bowel sounds.  No hepatosplenomegaly. Musculoskeletal: No joint deformity or effusion.  Full range of motion noted. Neurological: No deficits noted on motor, sensory and deep tendon reflex exam. Skin: No petechial rash or dryness.  Appeared moist.       Lab Results: Lab Results  Component Value Date   WBC 26.3 (H) 01/05/2020   HGB 14.1 01/05/2020   HCT 43.1 01/05/2020   MCV 91.1  01/05/2020   PLT 269 01/05/2020     Chemistry      Component Value Date/Time   NA 138 01/05/2020 0835   NA 142 12/09/2016 0839   K 4.4 01/05/2020 0835   K 4.8 12/09/2016 0839   CL 105 01/05/2020 0835   CO2 24 01/05/2020 0835   CO2 27 12/09/2016 0839   BUN 17 01/05/2020 0835   BUN 16.8 12/09/2016 0839   CREATININE 0.80 01/05/2020 0835   CREATININE 0.8 12/09/2016 0839      Component Value Date/Time   CALCIUM 10.6 (H) 01/05/2020 0835   CALCIUM 10.8 (H) 12/09/2016 0839   ALKPHOS 75 01/05/2020 0835   ALKPHOS 80 12/09/2016 0839   AST 17 01/05/2020 0835   AST 17 12/09/2016 0839   ALT 17 01/05/2020 0835   ALT 14 12/09/2016 0839   BILITOT 0.9 01/05/2020 0835   BILITOT 1.29 (H) 12/09/2016 0839       Impression and Plan:   75 year old woman with:   1.  CLL diagnosed in 2016.  She presented with stage 0 and lymphocytosis.  The natural course of her disease was reviewed at this time.  Laboratory testing: Last 5 years also discussed.  Indication for treatment for CLL were reviewed which includes rapid rise in her white cell count, painful adenopathy, bone marrow involvement and failure as well as an autoimmune considerations.  At this time, she does not have any indication for treatment and I recommended continued active surveillance.  Treatment options for this condition includes oral targeted therapy and systemic chemotherapy.  CBC from today  showed a white cell count of 26,000 which is consistent with her baseline which has not changed dramatically over the last 5 years.   2.  Covid vaccination considerations: She is up-to-date and received a booster in addition to her flu vaccine.  3. Follow-up: In 6 months for repeat follow-up  30  minutes were spent on this visit.  The time was dedicated to reviewing disease status, reviewing laboratory data, treatment options and future plan of care review.  Wendy Button, MD 1/20/20228:53 AM

## 2020-07-24 DIAGNOSIS — H2510 Age-related nuclear cataract, unspecified eye: Secondary | ICD-10-CM | POA: Diagnosis not present

## 2020-07-24 DIAGNOSIS — H40051 Ocular hypertension, right eye: Secondary | ICD-10-CM | POA: Diagnosis not present

## 2020-07-24 DIAGNOSIS — H2513 Age-related nuclear cataract, bilateral: Secondary | ICD-10-CM | POA: Diagnosis not present

## 2020-07-24 DIAGNOSIS — H5203 Hypermetropia, bilateral: Secondary | ICD-10-CM | POA: Diagnosis not present

## 2020-07-24 DIAGNOSIS — H40031 Anatomical narrow angle, right eye: Secondary | ICD-10-CM | POA: Diagnosis not present

## 2020-07-24 DIAGNOSIS — H524 Presbyopia: Secondary | ICD-10-CM | POA: Diagnosis not present

## 2020-07-24 DIAGNOSIS — H52221 Regular astigmatism, right eye: Secondary | ICD-10-CM | POA: Diagnosis not present

## 2020-08-23 ENCOUNTER — Other Ambulatory Visit: Payer: Self-pay | Admitting: Internal Medicine

## 2020-08-23 DIAGNOSIS — M810 Age-related osteoporosis without current pathological fracture: Secondary | ICD-10-CM

## 2020-08-27 ENCOUNTER — Other Ambulatory Visit: Payer: Medicare Other

## 2020-08-31 DIAGNOSIS — Z780 Asymptomatic menopausal state: Secondary | ICD-10-CM | POA: Diagnosis not present

## 2020-08-31 DIAGNOSIS — M810 Age-related osteoporosis without current pathological fracture: Secondary | ICD-10-CM | POA: Diagnosis not present

## 2020-08-31 DIAGNOSIS — M85852 Other specified disorders of bone density and structure, left thigh: Secondary | ICD-10-CM | POA: Diagnosis not present

## 2020-09-06 DIAGNOSIS — M810 Age-related osteoporosis without current pathological fracture: Secondary | ICD-10-CM | POA: Diagnosis not present

## 2020-10-04 DIAGNOSIS — L812 Freckles: Secondary | ICD-10-CM | POA: Diagnosis not present

## 2020-10-04 DIAGNOSIS — D1801 Hemangioma of skin and subcutaneous tissue: Secondary | ICD-10-CM | POA: Diagnosis not present

## 2020-10-04 DIAGNOSIS — D2261 Melanocytic nevi of right upper limb, including shoulder: Secondary | ICD-10-CM | POA: Diagnosis not present

## 2020-10-04 DIAGNOSIS — L819 Disorder of pigmentation, unspecified: Secondary | ICD-10-CM | POA: Diagnosis not present

## 2020-10-04 DIAGNOSIS — I788 Other diseases of capillaries: Secondary | ICD-10-CM | POA: Diagnosis not present

## 2020-10-04 DIAGNOSIS — D225 Melanocytic nevi of trunk: Secondary | ICD-10-CM | POA: Diagnosis not present

## 2020-10-04 DIAGNOSIS — D2272 Melanocytic nevi of left lower limb, including hip: Secondary | ICD-10-CM | POA: Diagnosis not present

## 2020-10-04 DIAGNOSIS — L814 Other melanin hyperpigmentation: Secondary | ICD-10-CM | POA: Diagnosis not present

## 2020-10-04 DIAGNOSIS — L821 Other seborrheic keratosis: Secondary | ICD-10-CM | POA: Diagnosis not present

## 2020-11-15 DIAGNOSIS — Z23 Encounter for immunization: Secondary | ICD-10-CM | POA: Diagnosis not present

## 2021-01-02 ENCOUNTER — Inpatient Hospital Stay: Payer: Medicare Other | Attending: Oncology

## 2021-01-02 ENCOUNTER — Inpatient Hospital Stay (HOSPITAL_BASED_OUTPATIENT_CLINIC_OR_DEPARTMENT_OTHER): Payer: Medicare Other | Admitting: Oncology

## 2021-01-02 ENCOUNTER — Other Ambulatory Visit: Payer: Self-pay

## 2021-01-02 VITALS — BP 127/59 | HR 72 | Temp 98.6°F | Resp 16 | Ht 61.0 in | Wt 108.2 lb

## 2021-01-02 DIAGNOSIS — C9110 Chronic lymphocytic leukemia of B-cell type not having achieved remission: Secondary | ICD-10-CM

## 2021-01-02 LAB — CBC WITH DIFFERENTIAL (CANCER CENTER ONLY)
Abs Immature Granulocytes: 0.03 10*3/uL (ref 0.00–0.07)
Basophils Absolute: 0.1 10*3/uL (ref 0.0–0.1)
Basophils Relative: 0 %
Eosinophils Absolute: 0 10*3/uL (ref 0.0–0.5)
Eosinophils Relative: 0 %
HCT: 39 % (ref 36.0–46.0)
Hemoglobin: 13.1 g/dL (ref 12.0–15.0)
Immature Granulocytes: 0 %
Lymphocytes Relative: 89 %
Lymphs Abs: 24.4 10*3/uL — ABNORMAL HIGH (ref 0.7–4.0)
MCH: 30.4 pg (ref 26.0–34.0)
MCHC: 33.6 g/dL (ref 30.0–36.0)
MCV: 90.5 fL (ref 80.0–100.0)
Monocytes Absolute: 0.5 10*3/uL (ref 0.1–1.0)
Monocytes Relative: 2 %
Neutro Abs: 2.6 10*3/uL (ref 1.7–7.7)
Neutrophils Relative %: 9 %
Platelet Count: 241 10*3/uL (ref 150–400)
RBC: 4.31 MIL/uL (ref 3.87–5.11)
RDW: 12.9 % (ref 11.5–15.5)
WBC Count: 27.6 10*3/uL — ABNORMAL HIGH (ref 4.0–10.5)
nRBC: 0.1 % (ref 0.0–0.2)

## 2021-01-02 LAB — CMP (CANCER CENTER ONLY)
ALT: 16 U/L (ref 0–44)
AST: 20 U/L (ref 15–41)
Albumin: 4.3 g/dL (ref 3.5–5.0)
Alkaline Phosphatase: 47 U/L (ref 38–126)
Anion gap: 11 (ref 5–15)
BUN: 19 mg/dL (ref 8–23)
CO2: 27 mmol/L (ref 22–32)
Calcium: 10.1 mg/dL (ref 8.9–10.3)
Chloride: 103 mmol/L (ref 98–111)
Creatinine: 0.59 mg/dL (ref 0.44–1.00)
GFR, Estimated: >60 mL/min (ref >60)
Glucose, Bld: 105 mg/dL — ABNORMAL HIGH (ref 70–99)
Potassium: 4.5 mmol/L (ref 3.5–5.1)
Sodium: 141 mmol/L (ref 135–145)
Total Bilirubin: 1.1 mg/dL (ref 0.3–1.2)
Total Protein: 6.8 g/dL (ref 6.5–8.1)

## 2021-01-02 NOTE — Progress Notes (Signed)
Hematology and Oncology Follow Up Visit  Wendy Hamilton [id number] 02/07/1946 75 y.o. 01/02/2021 10:18 AM [name], MDGriffin, [name], MD   Principle Diagnosis: 75 year old woman with stage 0 CLL diagnosed in 2016.  She was found to have lymphocytosis and no indication for treatment.     Current therapy: Active surveillance.  Interim History: Ms. Budreau is here for a repeat evaluation.  Since her last visit, she reports feeling well without any major complaints.  She denies any nausea, vomiting or constitutional symptoms.  She denies any lymphadenopathy or petechiae.  She denies any fevers chills or sweats.  Performance status quality of life is excellent.  She has been started on Fosamax for osteoporosis and no issues at this time.      Medications: She is currently on Norvasc in addition to Fosamax.   Allergies:  Allergies  Allergen Reactions   Nitrofurantoin Rash and Other (See Comments)    Joint pain       Physical Exam:   Blood pressure (!) 127/59, pulse 72, temperature 98.6 F (37 C), temperature source Oral, resp. rate 16, height 5\' 1"  (1.549 m), weight 108 lb 3.2 oz (49.1 kg), SpO2 100 %.      ECOG: 0    General appearance: Alert, awake without any distress. Head: Atraumatic without abnormalities Oropharynx: Without any thrush or ulcers. Eyes: No scleral icterus. Lymph nodes: No lymphadenopathy noted in the cervical, supraclavicular, or axillary nodes Heart:regular rate and rhythm, without any murmurs or gallops.   Lung: Clear to auscultation without any rhonchi, wheezes or dullness to percussion. Abdomin: Soft, nontender without any shifting dullness or ascites. Musculoskeletal: No clubbing or cyanosis. Neurological: No motor or sensory deficits. Skin: No rashes or lesions.       Lab Results: Lab Results  Component Value Date   WBC 27.6 (H) 01/02/2021   HGB 13.1 01/02/2021   HCT 39.0 01/02/2021   MCV 90.5 01/02/2021   PLT 241 01/02/2021      Chemistry      Component Value Date/Time   NA 141 07/05/2020 0856   NA 142 12/09/2016 0839   K 4.6 07/05/2020 0856   K 4.8 12/09/2016 0839   CL 107 07/05/2020 0856   CO2 25 07/05/2020 0856   CO2 27 12/09/2016 0839   BUN 20 07/05/2020 0856   BUN 16.8 12/09/2016 0839   CREATININE 0.72 07/05/2020 0856   CREATININE 0.8 12/09/2016 0839      Component Value Date/Time   CALCIUM 9.8 07/05/2020 0856   CALCIUM 10.8 (H) 12/09/2016 0839   ALKPHOS 69 07/05/2020 0856   ALKPHOS 80 12/09/2016 0839   AST 18 07/05/2020 0856   AST 17 12/09/2016 0839   ALT 15 07/05/2020 0856   ALT 14 12/09/2016 0839   BILITOT 0.9 07/05/2020 0856   BILITOT 1.29 (H) 12/09/2016 0839       Impression and Plan:   75 year old woman with:   1.  Stage 0 CLL presented with lymphocytosis and no lymphadenopathy diagnosed in 2016.    Her disease status were discussed at this time.  Laboratory data were also reviewed and continues to show very little changes noted over the last few years.  Her white cell count continues to be elevated which is consistent with CLL but no other hematological abnormalities.  Indication for treatment for this disease were discussed at this time.  This would include painful adenopathy, bone marrow involvement and cytopenias, constitutional symptoms, rapid rise in white cell count as well as autoimmune considerations.  At this time, she is not experiencing any of these symptoms and will continue active surveillance.   2.  Age-appropriate health maintenance: She is up-to-date at this time.  3. Follow-up: She will follow-up in 6 months for repeat evaluation.  30  minutes were dedicated to this encounter.  The time was spent on reviewing laboratory data, disease status update, treatment choices and future plan of care discussion.  [name], MD 7/20/202210:18 AM

## 2021-02-12 ENCOUNTER — Other Ambulatory Visit: Payer: Medicare Other

## 2021-04-02 DIAGNOSIS — H25013 Cortical age-related cataract, bilateral: Secondary | ICD-10-CM | POA: Diagnosis not present

## 2021-04-02 DIAGNOSIS — H2513 Age-related nuclear cataract, bilateral: Secondary | ICD-10-CM | POA: Diagnosis not present

## 2021-04-02 DIAGNOSIS — H18413 Arcus senilis, bilateral: Secondary | ICD-10-CM | POA: Diagnosis not present

## 2021-04-02 DIAGNOSIS — H25043 Posterior subcapsular polar age-related cataract, bilateral: Secondary | ICD-10-CM | POA: Diagnosis not present

## 2021-04-02 DIAGNOSIS — H2511 Age-related nuclear cataract, right eye: Secondary | ICD-10-CM | POA: Diagnosis not present

## 2021-05-03 DIAGNOSIS — Z1389 Encounter for screening for other disorder: Secondary | ICD-10-CM | POA: Diagnosis not present

## 2021-05-03 DIAGNOSIS — I10 Essential (primary) hypertension: Secondary | ICD-10-CM | POA: Diagnosis not present

## 2021-05-03 DIAGNOSIS — Z0000 Encounter for general adult medical examination without abnormal findings: Secondary | ICD-10-CM | POA: Diagnosis not present

## 2021-05-03 DIAGNOSIS — C9110 Chronic lymphocytic leukemia of B-cell type not having achieved remission: Secondary | ICD-10-CM | POA: Diagnosis not present

## 2021-05-03 DIAGNOSIS — E042 Nontoxic multinodular goiter: Secondary | ICD-10-CM | POA: Diagnosis not present

## 2021-05-03 DIAGNOSIS — M810 Age-related osteoporosis without current pathological fracture: Secondary | ICD-10-CM | POA: Diagnosis not present

## 2021-05-03 DIAGNOSIS — Z23 Encounter for immunization: Secondary | ICD-10-CM | POA: Diagnosis not present

## 2021-05-29 DIAGNOSIS — H2511 Age-related nuclear cataract, right eye: Secondary | ICD-10-CM | POA: Diagnosis not present

## 2021-05-30 DIAGNOSIS — H2512 Age-related nuclear cataract, left eye: Secondary | ICD-10-CM | POA: Diagnosis not present

## 2021-06-26 DIAGNOSIS — H2512 Age-related nuclear cataract, left eye: Secondary | ICD-10-CM | POA: Diagnosis not present

## 2021-06-27 DIAGNOSIS — H2512 Age-related nuclear cataract, left eye: Secondary | ICD-10-CM | POA: Diagnosis not present

## 2021-07-04 ENCOUNTER — Inpatient Hospital Stay: Payer: Medicare Other | Attending: Oncology

## 2021-07-04 ENCOUNTER — Other Ambulatory Visit: Payer: Self-pay

## 2021-07-04 ENCOUNTER — Inpatient Hospital Stay (HOSPITAL_BASED_OUTPATIENT_CLINIC_OR_DEPARTMENT_OTHER): Payer: Medicare Other | Admitting: Oncology

## 2021-07-04 VITALS — BP 138/78 | HR 71 | Temp 98.0°F | Resp 15 | Ht 61.0 in | Wt 110.3 lb

## 2021-07-04 DIAGNOSIS — C9110 Chronic lymphocytic leukemia of B-cell type not having achieved remission: Secondary | ICD-10-CM | POA: Diagnosis not present

## 2021-07-04 LAB — CBC WITH DIFFERENTIAL (CANCER CENTER ONLY)
Abs Immature Granulocytes: 0.03 10*3/uL (ref 0.00–0.07)
Basophils Absolute: 0.1 10*3/uL (ref 0.0–0.1)
Basophils Relative: 0 %
Eosinophils Absolute: 0 10*3/uL (ref 0.0–0.5)
Eosinophils Relative: 0 %
HCT: 43.2 % (ref 36.0–46.0)
Hemoglobin: 13.5 g/dL (ref 12.0–15.0)
Immature Granulocytes: 0 %
Lymphocytes Relative: 89 %
Lymphs Abs: 24.2 10*3/uL — ABNORMAL HIGH (ref 0.7–4.0)
MCH: 28.8 pg (ref 26.0–34.0)
MCHC: 31.3 g/dL (ref 30.0–36.0)
MCV: 92.3 fL (ref 80.0–100.0)
Monocytes Absolute: 0.4 10*3/uL (ref 0.1–1.0)
Monocytes Relative: 2 %
Neutro Abs: 2.4 10*3/uL (ref 1.7–7.7)
Neutrophils Relative %: 9 %
Platelet Count: 242 10*3/uL (ref 150–400)
RBC: 4.68 MIL/uL (ref 3.87–5.11)
RDW: 12.7 % (ref 11.5–15.5)
Smear Review: NORMAL
WBC Count: 27.2 10*3/uL — ABNORMAL HIGH (ref 4.0–10.5)
nRBC: 0 % (ref 0.0–0.2)

## 2021-07-04 LAB — CMP (CANCER CENTER ONLY)
ALT: 12 U/L (ref 0–44)
AST: 15 U/L (ref 15–41)
Albumin: 4.2 g/dL (ref 3.5–5.0)
Alkaline Phosphatase: 50 U/L (ref 38–126)
Anion gap: 5 (ref 5–15)
BUN: 18 mg/dL (ref 8–23)
CO2: 27 mmol/L (ref 22–32)
Calcium: 9.9 mg/dL (ref 8.9–10.3)
Chloride: 107 mmol/L (ref 98–111)
Creatinine: 0.72 mg/dL (ref 0.44–1.00)
GFR, Estimated: >60 mL/min (ref >60)
Glucose, Bld: 95 mg/dL (ref 70–99)
Potassium: 4.9 mmol/L (ref 3.5–5.1)
Sodium: 139 mmol/L (ref 135–145)
Total Bilirubin: 1 mg/dL (ref 0.3–1.2)
Total Protein: 6.8 g/dL (ref 6.5–8.1)

## 2021-07-04 NOTE — Progress Notes (Signed)
Hematology and Oncology Follow Up Visit  Wendy Hamilton [id number] [birth year] 76 y.o. 07/04/2021 8:36 AM Wendy Hamilton, MDGriffin, Wendy Reichmann, MD   Principle Diagnosis: 76 year old woman with CLL diagnosed in 2016.  She presented with stage 0 and lymphocytosis.  She did not have any indication for treatment.  Current therapy: Active surveillance.  Interim History: Wendy Hamilton returns today for a follow-up visit.  Since her last visit, she reports no major changes in her health.  She continues to feel well and attends to activities of daily living.  He denies any nausea, vomiting or abdominal pain.  She denies any lymphadenopathy or petechiae.  She denies any recurrent infections or hospitalizations.      Medications: Unchanged on review.   Allergies:  Allergies  Allergen Reactions   Nitrofurantoin Rash and Other (See Comments)    Joint pain       Physical Exam:   Blood pressure 138/78, pulse 71, temperature 98 F (36.7 C), temperature source Temporal, resp. rate 15, height 5\' 1"  (1.549 m), weight 110 lb 4.8 oz (50 kg), SpO2 100 %.      ECOG: 0   General appearance: Comfortable appearing without any discomfort Head: Normocephalic without any trauma Oropharynx: Mucous membranes are moist and pink without any thrush or ulcers. Eyes: Pupils are equal and round reactive to light. Lymph nodes: No cervical, supraclavicular, inguinal or axillary lymphadenopathy.   Heart:regular rate and rhythm.  S1 and S2 without leg edema. Lung: Clear without any rhonchi or wheezes.  No dullness to percussion. Abdomin: Soft, nontender, nondistended with good bowel sounds.  No hepatosplenomegaly. Musculoskeletal: No joint deformity or effusion.  Full range of motion noted. Neurological: No deficits noted on motor, sensory and deep tendon reflex exam. Skin: No petechial rash or dryness.  Appeared moist.         Lab Results: Lab Results  Component Value Date   WBC 27.2 (H) 07/04/2021    HGB 13.5 07/04/2021   HCT 43.2 07/04/2021   MCV 92.3 07/04/2021   PLT 242 07/04/2021     Chemistry      Component Value Date/Time   NA 141 01/02/2021 0956   NA 142 12/09/2016 0839   K 4.5 01/02/2021 0956   K 4.8 12/09/2016 0839   CL 103 01/02/2021 0956   CO2 27 01/02/2021 0956   CO2 27 12/09/2016 0839   BUN 19 01/02/2021 0956   BUN 16.8 12/09/2016 0839   CREATININE 0.59 01/02/2021 0956   CREATININE 0.8 12/09/2016 0839      Component Value Date/Time   CALCIUM 10.1 01/02/2021 0956   CALCIUM 10.8 (H) 12/09/2016 0839   ALKPHOS 47 01/02/2021 0956   ALKPHOS 80 12/09/2016 0839   AST 20 01/02/2021 0956   AST 17 12/09/2016 0839   ALT 16 01/02/2021 0956   ALT 14 12/09/2016 0839   BILITOT 1.1 01/02/2021 0956   BILITOT 1.29 (H) 12/09/2016 0839       Impression and Plan:   76 year old woman with:   1.  CLL diagnosed in 2016.  She presented with stage 0 and lymphocytosis.  The natural course of her disease and treatment choices were discussed today.  Laboratory data from today showed her lymphocytosis to be stable without any indication for treatment.  Painful adenopathy or constitutional symptoms or indication for treatment which she does not have.  At this time I recommended continued active surveillance.  Treatment options such as oral targeted therapy chemotherapy will be deferred at this time.  2.  Age-appropriate  health maintenance: I continue to encourage age-appropriate cancer screening and she is up-to-date.   3. Follow-up: In 6 months for a follow-up visit.  30  minutes were spent on this visit.  The time was dedicated to reviewing laboratory data, disease status update and outlining future plan of care discussion.  Wendy Button, MD 1/19/20238:36 AM

## 2021-07-05 IMAGING — CT CT HEART SCORING
3 series · 14 of 20 positions shown, 16 images · non-contrast
Comparison: None.

CLINICAL DATA: 73-year-old Caucasian female with current history of
hypertension, hyperlipidemia and CLL.

EXAM:
CT HEART FOR CALCIUM SCORING
TECHNIQUE: CT heart was performed using prospective ECG gating.
A non-contrast exam for calcium scoring was performed.
Note that this exam targets the heart and the chest was not imaged
in its entirety.

[Series 2: calcium scoring 2.00 qr36 bestdiast 69% · axial · 0.28mm/px · z∈[+1644,+1728]mm · 4 of 70 slices shown]
[im 14/70  vessel]
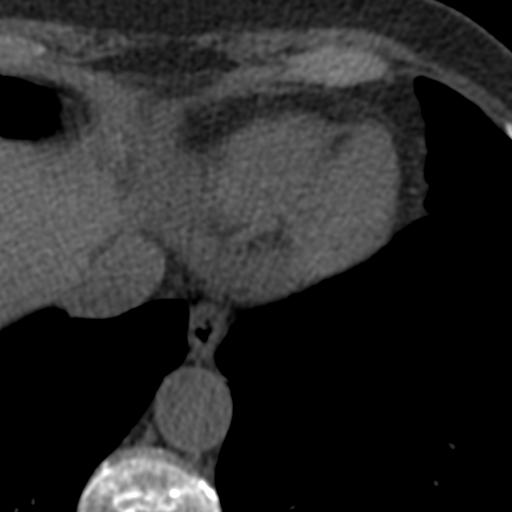
[im 28/70  vessel]
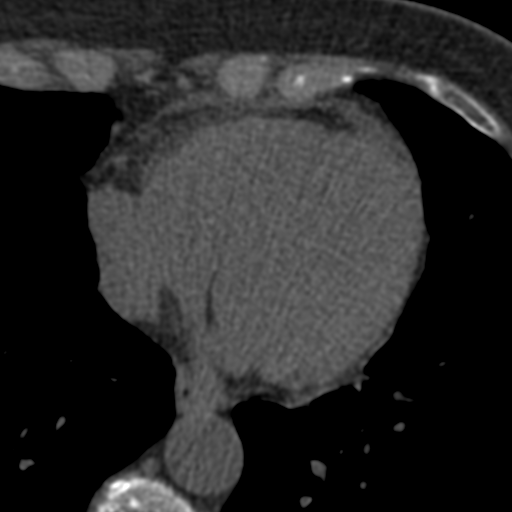
[im 42/70  vessel]
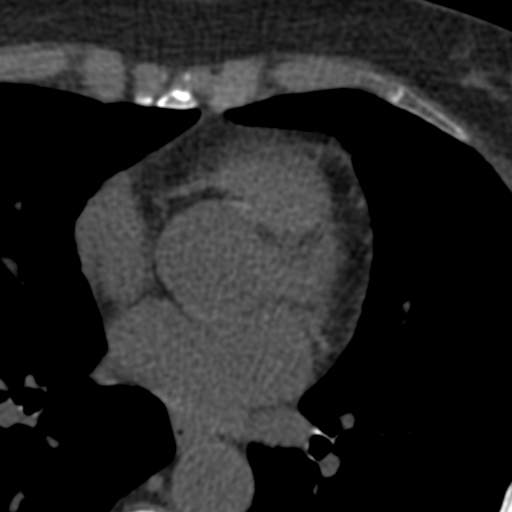
[im 56/70  vessel]
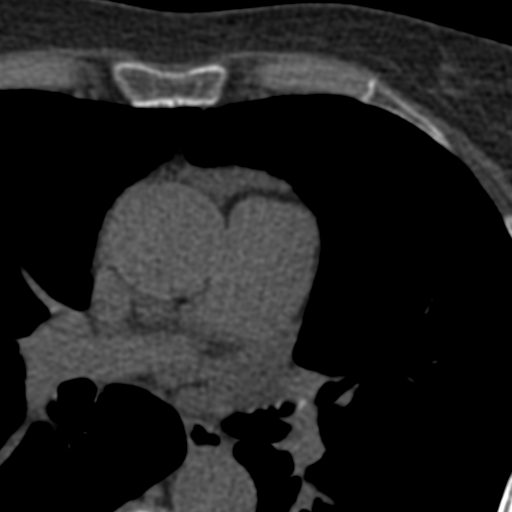

[Series 3: calcium scoring 2.00 br40 bestdiast 69% fov · axial · 0.49mm/px · z∈[+1640,+1732]mm · 5 of 70 slices shown, 7 images]
[im 12/70  vessel]
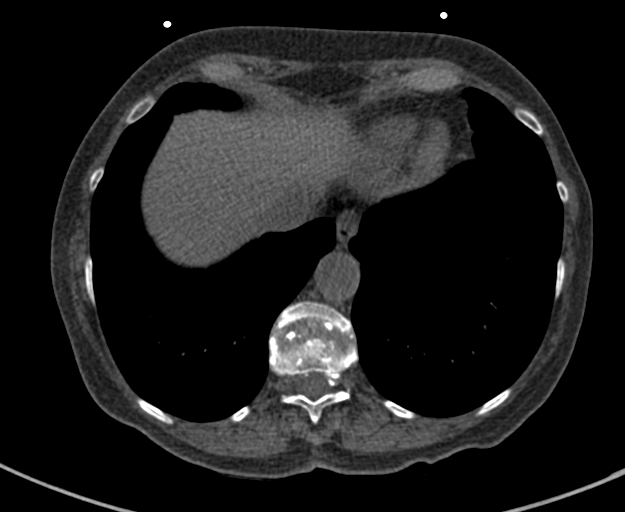
[im 12/70  lung]
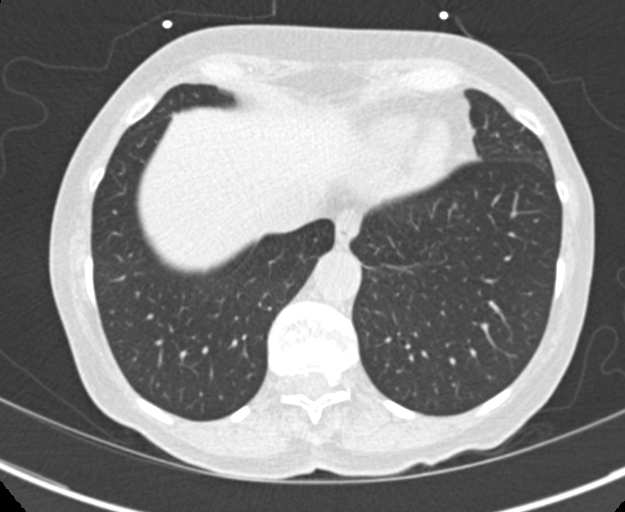
[im 24/70  vessel]
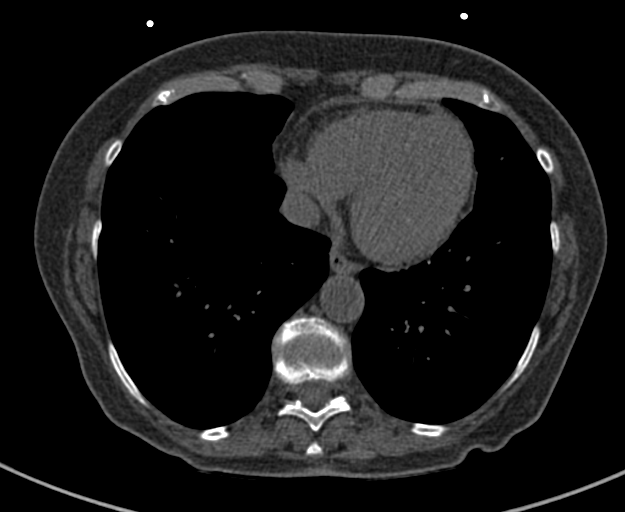
[im 35/70  vessel]
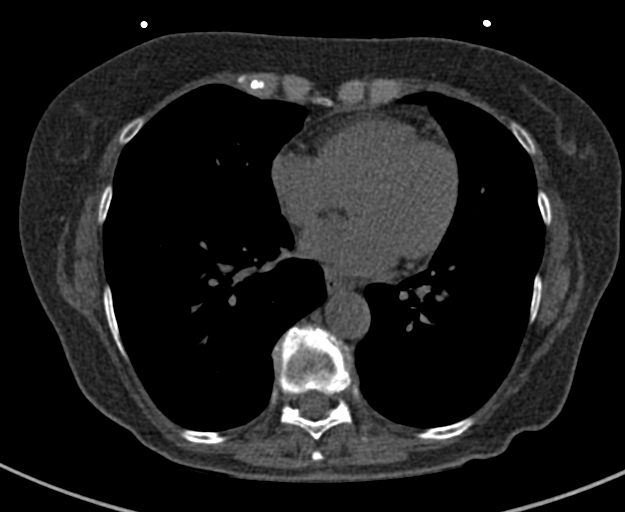
[im 47/70  vessel]
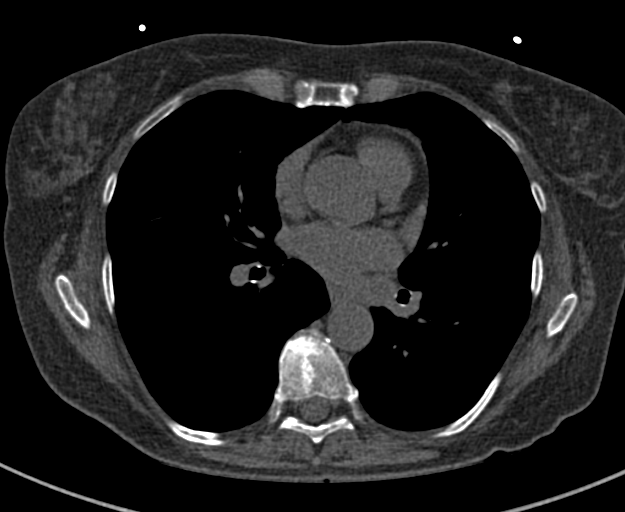
[im 58/70  vessel]
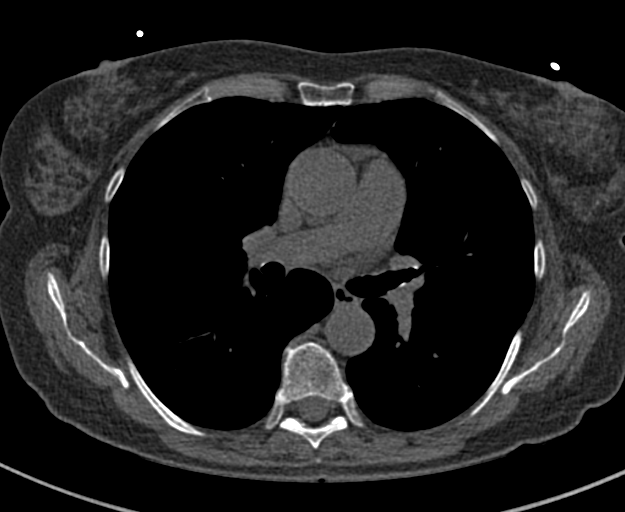
[im 58/70  lung]
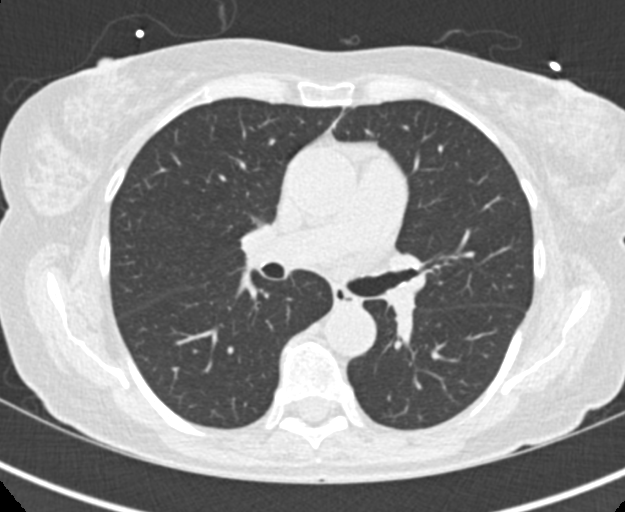

[Series 9: calcium scoring 2.00 br60 bestdiast 69% fov · axial · 0.49mm/px · z∈[+1640,+1732]mm · 5 of 70 slices shown]
[im 12/70  vessel]
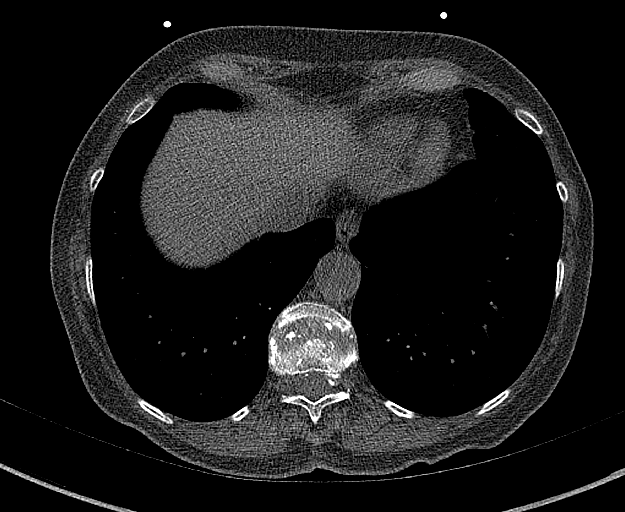
[im 24/70  vessel]
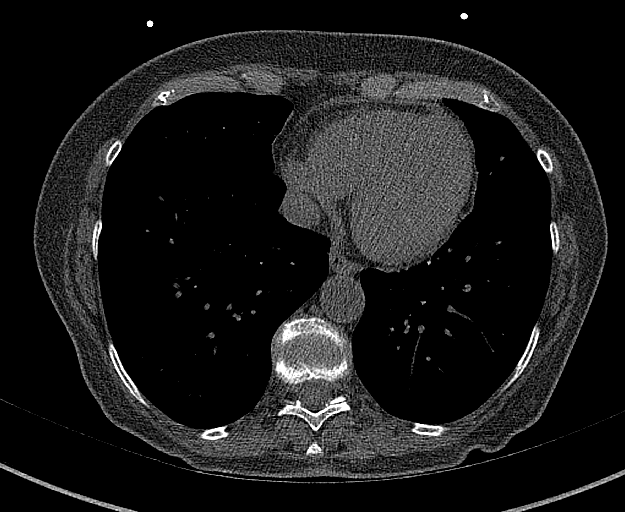
[im 35/70  vessel]
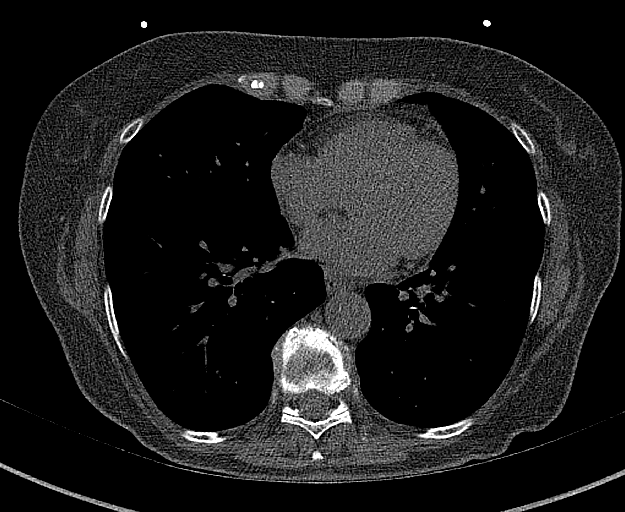
[im 47/70  vessel]
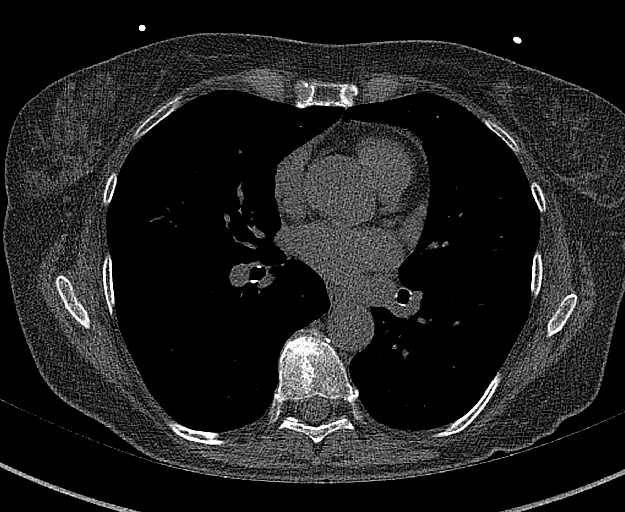
[im 58/70  vessel]
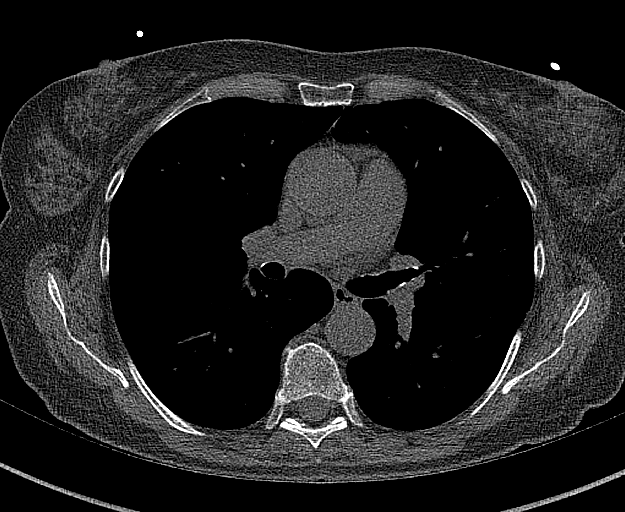

[14 of 20 positions shown; findings below may reference images not displayed]

FINDINGS: Technical quality: Good.

CORONARY CALCIUM

Total Agatston Score: 0.711. The coronary calcium involves the
distal LEFT circumflex artery, and there appears to be co-dominant
anatomy.

[HOSPITAL] percentile:  26th

OTHER FINDINGS:

Cardiovascular: Minimal descending thoracic aortic atherosclerosis.
Aortic annular calcification. No evidence of aortic aneurysm. Normal
heart size. Very small pericardial effusion.

Mediastinum/Nodes: No pathologic lymphadenopathy within the
visualized mediastinum. Visualized esophagus normal in appearance.

Lungs/Pleura: Visualized lung parenchyma clear. Central bronchi
patent without significant bronchial wall thickening. No pleural
effusions.

Upper Abdomen: Unremarkable for the unenhanced technique.

Musculoskeletal: Mild degenerative disc disease and spondylosis
involving the visualized LOWER thoracic spine. No acute findings.
IMPRESSION: 1. Total coronary artery Agatston score of 0.711 which places the
patient in the 26 percentile for age and race.
2. The coronary calcium involves the distal LEFT circumflex artery.
They are appears to be co-dominant coronary anatomy.
3.  Aortic Atherosclerosis, minimal.  ([id number])
4. Very small pericardial effusion.

## 2021-09-06 DIAGNOSIS — M7130 Other bursal cyst, unspecified site: Secondary | ICD-10-CM | POA: Diagnosis not present

## 2021-09-06 DIAGNOSIS — L570 Actinic keratosis: Secondary | ICD-10-CM | POA: Diagnosis not present

## 2021-09-06 DIAGNOSIS — D225 Melanocytic nevi of trunk: Secondary | ICD-10-CM | POA: Diagnosis not present

## 2021-09-06 DIAGNOSIS — L821 Other seborrheic keratosis: Secondary | ICD-10-CM | POA: Diagnosis not present

## 2021-09-06 DIAGNOSIS — L819 Disorder of pigmentation, unspecified: Secondary | ICD-10-CM | POA: Diagnosis not present

## 2021-09-06 DIAGNOSIS — D2272 Melanocytic nevi of left lower limb, including hip: Secondary | ICD-10-CM | POA: Diagnosis not present

## 2021-09-06 DIAGNOSIS — D224 Melanocytic nevi of scalp and neck: Secondary | ICD-10-CM | POA: Diagnosis not present

## 2021-09-06 DIAGNOSIS — D2271 Melanocytic nevi of right lower limb, including hip: Secondary | ICD-10-CM | POA: Diagnosis not present

## 2021-09-06 DIAGNOSIS — L812 Freckles: Secondary | ICD-10-CM | POA: Diagnosis not present

## 2021-09-06 DIAGNOSIS — D485 Neoplasm of uncertain behavior of skin: Secondary | ICD-10-CM | POA: Diagnosis not present

## 2021-10-14 DIAGNOSIS — L988 Other specified disorders of the skin and subcutaneous tissue: Secondary | ICD-10-CM | POA: Diagnosis not present

## 2021-10-14 DIAGNOSIS — D485 Neoplasm of uncertain behavior of skin: Secondary | ICD-10-CM | POA: Diagnosis not present

## 2021-11-19 DIAGNOSIS — C9110 Chronic lymphocytic leukemia of B-cell type not having achieved remission: Secondary | ICD-10-CM | POA: Diagnosis not present

## 2021-12-27 ENCOUNTER — Telehealth: Payer: Self-pay | Admitting: Oncology

## 2021-12-27 NOTE — Telephone Encounter (Signed)
Called patient regarding upcoming July appointments, patient has been called and voicemail was left. 

## 2022-01-01 ENCOUNTER — Inpatient Hospital Stay: Payer: Medicare Other | Attending: Oncology

## 2022-01-01 ENCOUNTER — Inpatient Hospital Stay (HOSPITAL_BASED_OUTPATIENT_CLINIC_OR_DEPARTMENT_OTHER): Payer: Medicare Other | Admitting: Oncology

## 2022-01-01 ENCOUNTER — Other Ambulatory Visit: Payer: Self-pay

## 2022-01-01 VITALS — BP 135/77 | HR 75 | Temp 97.8°F | Resp 15 | Ht 61.0 in | Wt 106.7 lb

## 2022-01-01 DIAGNOSIS — C9110 Chronic lymphocytic leukemia of B-cell type not having achieved remission: Secondary | ICD-10-CM | POA: Diagnosis not present

## 2022-01-01 LAB — CBC WITH DIFFERENTIAL (CANCER CENTER ONLY)
Abs Immature Granulocytes: 0.02 10*3/uL (ref 0.00–0.07)
Basophils Absolute: 0.1 10*3/uL (ref 0.0–0.1)
Basophils Relative: 0 %
Eosinophils Absolute: 0 10*3/uL (ref 0.0–0.5)
Eosinophils Relative: 0 %
HCT: 41.4 % (ref 36.0–46.0)
Hemoglobin: 13.5 g/dL (ref 12.0–15.0)
Immature Granulocytes: 0 %
Lymphocytes Relative: 89 %
Lymphs Abs: 26.7 10*3/uL — ABNORMAL HIGH (ref 0.7–4.0)
MCH: 30.1 pg (ref 26.0–34.0)
MCHC: 32.6 g/dL (ref 30.0–36.0)
MCV: 92.2 fL (ref 80.0–100.0)
Monocytes Absolute: 0.5 10*3/uL (ref 0.1–1.0)
Monocytes Relative: 2 %
Neutro Abs: 2.7 10*3/uL (ref 1.7–7.7)
Neutrophils Relative %: 9 %
Platelet Count: 262 10*3/uL (ref 150–400)
RBC: 4.49 MIL/uL (ref 3.87–5.11)
RDW: 12.9 % (ref 11.5–15.5)
Smear Review: NORMAL
WBC Count: 30.1 10*3/uL — ABNORMAL HIGH (ref 4.0–10.5)
nRBC: 0 % (ref 0.0–0.2)

## 2022-01-01 LAB — CMP (CANCER CENTER ONLY)
ALT: 12 U/L (ref 0–44)
AST: 17 U/L (ref 15–41)
Albumin: 4.3 g/dL (ref 3.5–5.0)
Alkaline Phosphatase: 53 U/L (ref 38–126)
Anion gap: 4 — ABNORMAL LOW (ref 5–15)
BUN: 16 mg/dL (ref 8–23)
CO2: 29 mmol/L (ref 22–32)
Calcium: 10.3 mg/dL (ref 8.9–10.3)
Chloride: 106 mmol/L (ref 98–111)
Creatinine: 0.7 mg/dL (ref 0.44–1.00)
GFR, Estimated: >60 mL/min (ref >60)
Glucose, Bld: 100 mg/dL — ABNORMAL HIGH (ref 70–99)
Potassium: 5.1 mmol/L (ref 3.5–5.1)
Sodium: 139 mmol/L (ref 135–145)
Total Bilirubin: 1 mg/dL (ref 0.3–1.2)
Total Protein: 6.7 g/dL (ref 6.5–8.1)

## 2022-01-01 NOTE — Progress Notes (Signed)
Hematology and Oncology Follow Up Visit  Wendy Hamilton [id number] [birth year] 76 y.o. 01/01/2022 10:00 AM [name], MDGriffin, Wendy Reichmann, MD   Principle Diagnosis: 76 year old woman with stage 0 CLL presented with lymphocytosis in 2016. .  Current therapy: Active surveillance.  Interim History: Wendy Hamilton returns today for a follow-up.  Since last visit, she reports feeling well without any major complaints.  She denies any nausea, vomiting or abdominal pain.  She denies any recent hospitalizations or illnesses.  She denies any painful adenopathy or constitutional symptoms.     Medications: Reviewed and updated.   Allergies:  Allergies  Allergen Reactions   Nitrofurantoin Rash and Other (See Comments)    Joint pain       Physical Exam:     Blood pressure 135/77, pulse 75, temperature 97.8 F (36.6 C), temperature source Temporal, resp. rate 15, height '5\' 1"'$  (1.549 m), weight 106 lb 11.2 oz (48.4 kg), SpO2 100 %.     ECOG: 0   General appearance: Alert, awake without any distress. Head: Atraumatic without abnormalities Oropharynx: Without any thrush or ulcers. Eyes: No scleral icterus. Lymph nodes: No lymphadenopathy noted in the cervical, supraclavicular, or axillary nodes Heart:regular rate and rhythm, without any murmurs or gallops.   Lung: Clear to auscultation without any rhonchi, wheezes or dullness to percussion. Abdomin: Soft, nontender without any shifting dullness or ascites. Musculoskeletal: No clubbing or cyanosis. Neurological: No motor or sensory deficits. Skin: No rashes or lesions.         Lab Results: Lab Results  Component Value Date   WBC 27.2 (H) 07/04/2021   HGB 13.5 07/04/2021   HCT 43.2 07/04/2021   MCV 92.3 07/04/2021   PLT 242 07/04/2021     Chemistry      Component Value Date/Time   NA 139 07/04/2021 0814   NA 142 12/09/2016 0839   K 4.9 07/04/2021 0814   K 4.8 12/09/2016 0839   CL 107 07/04/2021 0814   CO2 27  07/04/2021 0814   CO2 27 12/09/2016 0839   BUN 18 07/04/2021 0814   BUN 16.8 12/09/2016 0839   CREATININE 0.72 07/04/2021 0814   CREATININE 0.8 12/09/2016 0839      Component Value Date/Time   CALCIUM 9.9 07/04/2021 0814   CALCIUM 10.8 (H) 12/09/2016 0839   ALKPHOS 50 07/04/2021 0814   ALKPHOS 80 12/09/2016 0839   AST 15 07/04/2021 0814   AST 17 12/09/2016 0839   ALT 12 07/04/2021 0814   ALT 14 12/09/2016 0839   BILITOT 1.0 07/04/2021 0814   BILITOT 1.29 (H) 12/09/2016 0839       Impression and Plan:   76 year old woman with:   1.  Stage 0 CLL presented with lymphocytosis in 2016.  She continues to be on active surveillance without any indication for treatment with lymphocytosis that has remained relatively stable.  He does have slow progression of her white cell count increased but has been rather slow and asymptomatic.  The natural course of this disease and indication for treatment were reiterated.  Painful adenopathy, bone marrow suppression among others were discussed today and she continues to be asymptomatic at this time.  I recommended monitoring.  Treatment options including oral targeted therapy as well as systemic chemotherapy were reviewed.  Laboratory data from today reviewed and showed slight increase in her white cell count but overall disease status is stable.  2.  Age-appropriate health maintenance: She is up-to-date at this time I encouraged her to continue to do so.  3. Follow-up: She will return in 6 months for follow-up.  30  minutes were dedicated to this encounter.  The time was spent on reviewing her disease status, reviewing laboratory data, treatment choices and future plan of care discussion.  [name], MD 7/19/202310:00 AM

## 2022-03-24 DIAGNOSIS — N390 Urinary tract infection, site not specified: Secondary | ICD-10-CM | POA: Diagnosis not present

## 2022-03-31 DIAGNOSIS — S0501XA Injury of conjunctiva and corneal abrasion without foreign body, right eye, initial encounter: Secondary | ICD-10-CM | POA: Diagnosis not present

## 2022-03-31 DIAGNOSIS — H5213 Myopia, bilateral: Secondary | ICD-10-CM | POA: Diagnosis not present

## 2022-03-31 DIAGNOSIS — H52223 Regular astigmatism, bilateral: Secondary | ICD-10-CM | POA: Diagnosis not present

## 2022-03-31 DIAGNOSIS — H524 Presbyopia: Secondary | ICD-10-CM | POA: Diagnosis not present

## 2022-04-17 DIAGNOSIS — H1031 Unspecified acute conjunctivitis, right eye: Secondary | ICD-10-CM | POA: Diagnosis not present

## 2022-04-23 ENCOUNTER — Other Ambulatory Visit (HOSPITAL_BASED_OUTPATIENT_CLINIC_OR_DEPARTMENT_OTHER): Payer: Self-pay

## 2022-04-23 DIAGNOSIS — Z23 Encounter for immunization: Secondary | ICD-10-CM | POA: Diagnosis not present

## 2022-04-23 MED ORDER — INFLUENZA VAC A&B SA ADJ QUAD 0.5 ML IM PRSY
PREFILLED_SYRINGE | INTRAMUSCULAR | 0 refills | Status: DC
  Filled 2022-04-23: qty 0.5, 1d supply, fill #0

## 2022-04-23 MED ORDER — COVID-19 MRNA VAC-TRIS(PFIZER) 30 MCG/0.3ML IM SUSY
0.3000 mL | PREFILLED_SYRINGE | Freq: Once | INTRAMUSCULAR | 0 refills | Status: AC
  Filled 2022-04-23: qty 0.3, 1d supply, fill #0

## 2022-05-07 DIAGNOSIS — L570 Actinic keratosis: Secondary | ICD-10-CM | POA: Diagnosis not present

## 2022-06-19 DIAGNOSIS — E7800 Pure hypercholesterolemia, unspecified: Secondary | ICD-10-CM | POA: Diagnosis not present

## 2022-06-19 DIAGNOSIS — E042 Nontoxic multinodular goiter: Secondary | ICD-10-CM | POA: Diagnosis not present

## 2022-06-19 DIAGNOSIS — Z23 Encounter for immunization: Secondary | ICD-10-CM | POA: Diagnosis not present

## 2022-06-19 DIAGNOSIS — M810 Age-related osteoporosis without current pathological fracture: Secondary | ICD-10-CM | POA: Diagnosis not present

## 2022-06-19 DIAGNOSIS — C9110 Chronic lymphocytic leukemia of B-cell type not having achieved remission: Secondary | ICD-10-CM | POA: Diagnosis not present

## 2022-06-19 DIAGNOSIS — Z0000 Encounter for general adult medical examination without abnormal findings: Secondary | ICD-10-CM | POA: Diagnosis not present

## 2022-06-19 DIAGNOSIS — Z1331 Encounter for screening for depression: Secondary | ICD-10-CM | POA: Diagnosis not present

## 2022-06-19 DIAGNOSIS — I10 Essential (primary) hypertension: Secondary | ICD-10-CM | POA: Diagnosis not present

## 2022-06-27 ENCOUNTER — Other Ambulatory Visit: Payer: Self-pay | Admitting: *Deleted

## 2022-06-27 DIAGNOSIS — C9110 Chronic lymphocytic leukemia of B-cell type not having achieved remission: Secondary | ICD-10-CM

## 2022-07-02 ENCOUNTER — Inpatient Hospital Stay: Payer: Medicare Other | Attending: Oncology

## 2022-07-02 ENCOUNTER — Inpatient Hospital Stay (HOSPITAL_BASED_OUTPATIENT_CLINIC_OR_DEPARTMENT_OTHER): Payer: Medicare Other | Admitting: Oncology

## 2022-07-02 VITALS — BP 142/55 | HR 72 | Temp 98.1°F | Resp 16 | Wt 107.0 lb

## 2022-07-02 DIAGNOSIS — M810 Age-related osteoporosis without current pathological fracture: Secondary | ICD-10-CM | POA: Insufficient documentation

## 2022-07-02 DIAGNOSIS — C9110 Chronic lymphocytic leukemia of B-cell type not having achieved remission: Secondary | ICD-10-CM

## 2022-07-02 LAB — CBC WITH DIFFERENTIAL (CANCER CENTER ONLY)
Abs Immature Granulocytes: 0.05 10*3/uL (ref 0.00–0.07)
Basophils Absolute: 0.1 10*3/uL (ref 0.0–0.1)
Basophils Relative: 0 %
Eosinophils Absolute: 0.1 10*3/uL (ref 0.0–0.5)
Eosinophils Relative: 0 %
HCT: 42.4 % (ref 36.0–46.0)
Hemoglobin: 13.5 g/dL (ref 12.0–15.0)
Immature Granulocytes: 0 %
Lymphocytes Relative: 87 %
Lymphs Abs: 26.2 10*3/uL — ABNORMAL HIGH (ref 0.7–4.0)
MCH: 29.3 pg (ref 26.0–34.0)
MCHC: 31.8 g/dL (ref 30.0–36.0)
MCV: 92 fL (ref 80.0–100.0)
Monocytes Absolute: 0.5 10*3/uL (ref 0.1–1.0)
Monocytes Relative: 2 %
Neutro Abs: 3.5 10*3/uL (ref 1.7–7.7)
Neutrophils Relative %: 11 %
Platelet Count: 265 10*3/uL (ref 150–400)
RBC: 4.61 MIL/uL (ref 3.87–5.11)
RDW: 13 % (ref 11.5–15.5)
WBC Count: 30.3 10*3/uL — ABNORMAL HIGH (ref 4.0–10.5)
nRBC: 0 % (ref 0.0–0.2)

## 2022-07-02 NOTE — Progress Notes (Signed)
  [facility] OFFICE PROGRESS NOTE   Diagnosis: CLL  INTERVAL HISTORY:   Wendy Hamilton has been followed by Dr. Alen Blew with a diagnosis of CLL since 2016.  She has not required treatment.  She feels well.  No fever, night sweats, anorexia, or recent infection.  She is up-to-date on influenza and pneumonia vaccines.  She feels there may be a lymph node in the high left neck.  Objective:  Vital signs in last 24 hours:  Blood pressure (!) 142/55, pulse 72, temperature 98.1 F (36.7 C), temperature source Temporal, resp. rate 16, weight 107 lb (48.5 kg), SpO2 99 %.    HEENT: Neck without mass Lymphatics: No cervical, supraclavicular, axillary, femoral, or inguinal nodes Resp: Lungs clear bilaterally Cardio: Regular rate and rhythm GI: No hepatosplenomegaly Vascular: No leg edema   Lab Results:  Lab Results  Component Value Date   WBC 30.3 (H) 07/02/2022   HGB 13.5 07/02/2022   HCT 42.4 07/02/2022   MCV 92.0 07/02/2022   PLT 265 07/02/2022   NEUTROABS 3.5 07/02/2022    CMP  Lab Results  Component Value Date   NA 139 01/01/2022   K 5.1 01/01/2022   CL 106 01/01/2022   CO2 29 01/01/2022   GLUCOSE 100 (H) 01/01/2022   BUN 16 01/01/2022   CREATININE 0.70 01/01/2022   CALCIUM 10.3 01/01/2022   PROT 6.7 01/01/2022   ALBUMIN 4.3 01/01/2022   AST 17 01/01/2022   ALT 12 01/01/2022   ALKPHOS 53 01/01/2022   BILITOT 1.0 01/01/2022   GFRNONAA >60 01/01/2022   GFRAA >60 01/05/2020     Medications: I have reviewed the patient's current medications.   Assessment/Plan: Stage 0 CLL presented with lymphocytosis in 2016.  Flow cytometry 03/15/2015 consistent with chronic lymphocytic leukemia Osteoporosis Pretension  Wendy Hamilton has early stage CLL.  She is stable from a hematologic standpoint and asymptomatic.  There is no indication for treating the CLL.  We discussed indications for treating CLL.  She is at increased risk for infection.  I recommend she stay  up-to-date on the influenza, pneumonia, COVID-19, and RSV vaccines.  She will return for an office visit and CBC in 8 months.  She will call in the interim for new symptoms.  She will seek medical attention for symptoms of an infection.   Betsy Coder, MD  07/02/2022  12:56 PM

## 2022-07-04 ENCOUNTER — Other Ambulatory Visit: Payer: Medicare Other

## 2022-07-04 ENCOUNTER — Inpatient Hospital Stay: Payer: Medicare Other | Admitting: Oncology

## 2022-07-04 ENCOUNTER — Ambulatory Visit: Payer: Medicare Other | Admitting: Oncology

## 2022-07-04 ENCOUNTER — Inpatient Hospital Stay: Payer: Medicare Other

## 2022-07-04 DIAGNOSIS — E042 Nontoxic multinodular goiter: Secondary | ICD-10-CM | POA: Diagnosis not present

## 2022-09-02 DIAGNOSIS — D2272 Melanocytic nevi of left lower limb, including hip: Secondary | ICD-10-CM | POA: Diagnosis not present

## 2022-09-02 DIAGNOSIS — D225 Melanocytic nevi of trunk: Secondary | ICD-10-CM | POA: Diagnosis not present

## 2022-09-02 DIAGNOSIS — L821 Other seborrheic keratosis: Secondary | ICD-10-CM | POA: Diagnosis not present

## 2022-09-02 DIAGNOSIS — L819 Disorder of pigmentation, unspecified: Secondary | ICD-10-CM | POA: Diagnosis not present

## 2022-09-02 DIAGNOSIS — L814 Other melanin hyperpigmentation: Secondary | ICD-10-CM | POA: Diagnosis not present

## 2022-09-02 DIAGNOSIS — D224 Melanocytic nevi of scalp and neck: Secondary | ICD-10-CM | POA: Diagnosis not present

## 2022-09-02 DIAGNOSIS — L570 Actinic keratosis: Secondary | ICD-10-CM | POA: Diagnosis not present

## 2022-09-02 DIAGNOSIS — D2271 Melanocytic nevi of right lower limb, including hip: Secondary | ICD-10-CM | POA: Diagnosis not present

## 2022-09-09 ENCOUNTER — Telehealth: Payer: Self-pay

## 2022-09-09 NOTE — Patient Instructions (Signed)
Visit Information  Thank you for taking time to visit with me today. Please don't hesitate to contact me if I can be of assistance to you.   Following are the goals we discussed today:   Goals Addressed             This Visit's Progress    COMPLETED: Care Coordination Activities- No Follow Up Required       Interventions Today    Flowsheet Row Most Recent Value  General Interventions   General Interventions Discussed/Reviewed General Interventions Discussed, Doctor Visits  Doctor Visits Discussed/Reviewed Doctor Visits Discussed, Annual Wellness Visits, PCP  PCP/Specialist Visits Compliance with follow-up visit  Exercise Interventions   Exercise Discussed/Reviewed Exercise Discussed  Education Interventions   Education Provided Provided Education  Provided Verbal Education On Other  [care coordination services]               If you are experiencing a Mental Health or [place] or need someone to talk to, please call the Suicide and Crisis Lifeline: 988 call the [place] National Suicide Prevention Lifeline: (848)150-2252 or TTY: 629-609-5354 [place] 581-589-5776) to talk to a trained counselor call 1-800-273-TALK (toll free, 24 hour hotline) go to [facility] [place] [phone]) call 911   Patient verbalizes understanding of instructions and care plan provided today and agrees to view in [place]. Active MyChart status and patient understanding of how to access instructions and care plan via MyChart confirmed with patient.     No further follow up required:    [place], Jackquline Denmark, [place] [facility] [phone]

## 2022-09-09 NOTE — Patient Outreach (Signed)
  Care Coordination   Initial Visit Note   09/09/2022 Name: Wendy Hamilton MRN: [id number] DOB: 1945-07-13  Wendy Hamilton is a 77 y.o. year old female who sees Kathalene Frames, MD for primary care. I spoke with  Terrilee Croak by phone today.  What matters to the patients health and wellness today?  No concerns today    Goals Addressed             This Visit's Progress    COMPLETED: Care Coordination Activities- No Follow Up Required       Interventions Today    Flowsheet Row Most Recent Value  General Interventions   General Interventions Discussed/Reviewed General Interventions Discussed, Doctor Visits  Doctor Visits Discussed/Reviewed Doctor Visits Discussed, Annual Wellness Visits, PCP  PCP/Specialist Visits Compliance with follow-up visit  Exercise Interventions   Exercise Discussed/Reviewed Exercise Discussed  Education Interventions   Education Provided Provided Education  Provided Verbal Education On Other  [care coordination services]              SDOH assessments and interventions completed:  Yes  SDOH Interventions Today    Flowsheet Row Most Recent Value  SDOH Interventions   Food Insecurity Interventions Intervention Not Indicated  Housing Interventions Intervention Not Indicated  Transportation Interventions Intervention Not Indicated  Utilities Interventions Intervention Not Indicated  Physical Activity Interventions Intervention Not Indicated        Care Coordination Interventions:  Yes, provided   Follow up plan: No further intervention required.   Encounter Outcome:  Pt. Visit Completed  Peter Garter RN, BSN,CCM, CDE Care Management Coordinator [place] [facility] [phone]

## 2023-02-09 DIAGNOSIS — H26491 Other secondary cataract, right eye: Secondary | ICD-10-CM | POA: Diagnosis not present

## 2023-02-09 DIAGNOSIS — Z961 Presence of intraocular lens: Secondary | ICD-10-CM | POA: Diagnosis not present

## 2023-02-09 DIAGNOSIS — H524 Presbyopia: Secondary | ICD-10-CM | POA: Diagnosis not present

## 2023-02-09 DIAGNOSIS — H52223 Regular astigmatism, bilateral: Secondary | ICD-10-CM | POA: Diagnosis not present

## 2023-02-09 DIAGNOSIS — H5213 Myopia, bilateral: Secondary | ICD-10-CM | POA: Diagnosis not present

## 2023-02-09 DIAGNOSIS — H1031 Unspecified acute conjunctivitis, right eye: Secondary | ICD-10-CM | POA: Diagnosis not present

## 2023-03-03 ENCOUNTER — Inpatient Hospital Stay (HOSPITAL_BASED_OUTPATIENT_CLINIC_OR_DEPARTMENT_OTHER): Payer: Medicare Other | Admitting: Oncology

## 2023-03-03 ENCOUNTER — Inpatient Hospital Stay: Payer: Medicare Other | Attending: Oncology

## 2023-03-03 VITALS — BP 134/65 | HR 75 | Temp 98.1°F | Resp 18 | Ht 61.0 in | Wt 111.7 lb

## 2023-03-03 DIAGNOSIS — C9110 Chronic lymphocytic leukemia of B-cell type not having achieved remission: Secondary | ICD-10-CM

## 2023-03-03 DIAGNOSIS — M810 Age-related osteoporosis without current pathological fracture: Secondary | ICD-10-CM | POA: Diagnosis not present

## 2023-03-03 DIAGNOSIS — I10 Essential (primary) hypertension: Secondary | ICD-10-CM | POA: Insufficient documentation

## 2023-03-03 LAB — CBC WITH DIFFERENTIAL (CANCER CENTER ONLY)
Abs Immature Granulocytes: 0.08 10*3/uL — ABNORMAL HIGH (ref 0.00–0.07)
Basophils Absolute: 0.1 10*3/uL (ref 0.0–0.1)
Basophils Relative: 0 %
Eosinophils Absolute: 0 10*3/uL (ref 0.0–0.5)
Eosinophils Relative: 0 %
HCT: 40.4 % (ref 36.0–46.0)
Hemoglobin: 13 g/dL (ref 12.0–15.0)
Immature Granulocytes: 0 %
Lymphocytes Relative: 78 %
Lymphs Abs: 24.1 10*3/uL — ABNORMAL HIGH (ref 0.7–4.0)
MCH: 29.8 pg (ref 26.0–34.0)
MCHC: 32.2 g/dL (ref 30.0–36.0)
MCV: 92.7 fL (ref 80.0–100.0)
Monocytes Absolute: 0.9 10*3/uL (ref 0.1–1.0)
Monocytes Relative: 3 %
Neutro Abs: 6 10*3/uL (ref 1.7–7.7)
Neutrophils Relative %: 19 %
Platelet Count: 230 10*3/uL (ref 150–400)
RBC: 4.36 MIL/uL (ref 3.87–5.11)
RDW: 13 % (ref 11.5–15.5)
WBC Count: 31.2 10*3/uL — ABNORMAL HIGH (ref 4.0–10.5)
nRBC: 0.1 % (ref 0.0–0.2)

## 2023-03-03 NOTE — Progress Notes (Signed)
  [facility] OFFICE PROGRESS NOTE   Diagnosis: CLL  INTERVAL HISTORY:   Wendy Hamilton returns as scheduled.  She feels well.  No fever or night sweats.  She recently had a "yeast "infection after taking an antibiotic from the dentist.  No other infections.  She has noted prominence of the submandibular glands.  Objective:  Vital signs in last 24 hours:  Blood pressure 134/65, pulse 75, temperature 98.1 F (36.7 C), temperature source Temporal, resp. rate 18, height 5\' 1"  (1.549 m), weight 111 lb 11.2 oz (50.7 kg), SpO2 99%.    HEENT: Symmetric submandibular glands, pharynx without mass Lymphatics: No cervical, supraclavicular, axillary, or inguinal nodes Resp: Lungs clear bilaterally Cardio: Regular rate and rhythm GI: No hepatosplenomegaly Vascular: No leg edema   Lab Results:  Lab Results  Component Value Date   WBC 31.2 (H) 03/03/2023   HGB 13.0 03/03/2023   HCT 40.4 03/03/2023   MCV 92.7 03/03/2023   PLT 230 03/03/2023   NEUTROABS 6.0 03/03/2023    CMP  Lab Results  Component Value Date   NA 139 01/01/2022   K 5.1 01/01/2022   CL 106 01/01/2022   CO2 29 01/01/2022   GLUCOSE 100 (H) 01/01/2022   BUN 16 01/01/2022   CREATININE 0.70 01/01/2022   CALCIUM 10.3 01/01/2022   PROT 6.7 01/01/2022   ALBUMIN 4.3 01/01/2022   AST 17 01/01/2022   ALT 12 01/01/2022   ALKPHOS 53 01/01/2022   BILITOT 1.0 01/01/2022   GFRNONAA >60 01/01/2022   GFRAA >60 01/05/2020     Medications: I have reviewed the patient's current medications.   Assessment/Plan:  Stage 0 CLL presented with lymphocytosis in 2016.  Flow cytometry 03/15/2015 consistent with chronic lymphocytic leukemia Osteoporosis Hypertension    Disposition: Wendy Hamilton appears stable.  She has early stage CLL.  She is stable from a hematologic standpoint and asymptomatic from the CLL.  There is no indication for treating the CLL at present.  She will remain up-to-date on influenza and  pneumonia vaccines.  She will return for an office visit in 9 months.  Thornton Papas, MD  03/03/2023  12:45 PM

## 2023-03-09 ENCOUNTER — Other Ambulatory Visit (HOSPITAL_BASED_OUTPATIENT_CLINIC_OR_DEPARTMENT_OTHER): Payer: Self-pay

## 2023-03-09 DIAGNOSIS — Z23 Encounter for immunization: Secondary | ICD-10-CM | POA: Diagnosis not present

## 2023-03-09 MED ORDER — INFLUENZA VAC A&B SURF ANT ADJ 0.5 ML IM SUSY
0.5000 mL | PREFILLED_SYRINGE | Freq: Once | INTRAMUSCULAR | 0 refills | Status: AC
  Filled 2023-03-09: qty 0.5, 1d supply, fill #0

## 2023-05-26 DIAGNOSIS — H26491 Other secondary cataract, right eye: Secondary | ICD-10-CM | POA: Diagnosis not present

## 2023-05-26 DIAGNOSIS — H18513 Endothelial corneal dystrophy, bilateral: Secondary | ICD-10-CM | POA: Diagnosis not present

## 2023-05-26 DIAGNOSIS — H18413 Arcus senilis, bilateral: Secondary | ICD-10-CM | POA: Diagnosis not present

## 2023-05-26 DIAGNOSIS — Z961 Presence of intraocular lens: Secondary | ICD-10-CM | POA: Diagnosis not present

## 2023-06-02 DIAGNOSIS — Z9841 Cataract extraction status, right eye: Secondary | ICD-10-CM | POA: Diagnosis not present

## 2023-06-02 DIAGNOSIS — H2513 Age-related nuclear cataract, bilateral: Secondary | ICD-10-CM | POA: Diagnosis not present

## 2023-06-02 DIAGNOSIS — Z961 Presence of intraocular lens: Secondary | ICD-10-CM | POA: Diagnosis not present

## 2023-06-25 DIAGNOSIS — M810 Age-related osteoporosis without current pathological fracture: Secondary | ICD-10-CM | POA: Diagnosis not present

## 2023-06-25 DIAGNOSIS — Z0000 Encounter for general adult medical examination without abnormal findings: Secondary | ICD-10-CM | POA: Diagnosis not present

## 2023-06-25 DIAGNOSIS — E042 Nontoxic multinodular goiter: Secondary | ICD-10-CM | POA: Diagnosis not present

## 2023-06-25 DIAGNOSIS — C9110 Chronic lymphocytic leukemia of B-cell type not having achieved remission: Secondary | ICD-10-CM | POA: Diagnosis not present

## 2023-06-25 DIAGNOSIS — I10 Essential (primary) hypertension: Secondary | ICD-10-CM | POA: Diagnosis not present

## 2023-06-25 DIAGNOSIS — E7800 Pure hypercholesterolemia, unspecified: Secondary | ICD-10-CM | POA: Diagnosis not present

## 2023-06-25 DIAGNOSIS — Z79899 Other long term (current) drug therapy: Secondary | ICD-10-CM | POA: Diagnosis not present

## 2023-06-25 DIAGNOSIS — Z1331 Encounter for screening for depression: Secondary | ICD-10-CM | POA: Diagnosis not present

## 2023-06-25 DIAGNOSIS — N390 Urinary tract infection, site not specified: Secondary | ICD-10-CM | POA: Diagnosis not present

## 2023-07-03 ENCOUNTER — Other Ambulatory Visit (HOSPITAL_BASED_OUTPATIENT_CLINIC_OR_DEPARTMENT_OTHER): Payer: Self-pay | Admitting: Internal Medicine

## 2023-07-03 DIAGNOSIS — E7800 Pure hypercholesterolemia, unspecified: Secondary | ICD-10-CM

## 2023-07-21 DIAGNOSIS — M810 Age-related osteoporosis without current pathological fracture: Secondary | ICD-10-CM | POA: Diagnosis not present

## 2023-07-21 DIAGNOSIS — M8588 Other specified disorders of bone density and structure, other site: Secondary | ICD-10-CM | POA: Diagnosis not present

## 2023-08-10 DIAGNOSIS — R311 Benign essential microscopic hematuria: Secondary | ICD-10-CM | POA: Diagnosis not present

## 2023-08-10 DIAGNOSIS — N3020 Other chronic cystitis without hematuria: Secondary | ICD-10-CM | POA: Diagnosis not present

## 2023-08-21 ENCOUNTER — Ambulatory Visit (HOSPITAL_BASED_OUTPATIENT_CLINIC_OR_DEPARTMENT_OTHER)
Admission: RE | Admit: 2023-08-21 | Discharge: 2023-08-21 | Disposition: A | Discharge status: Home / Self Care | Payer: Self-pay | Source: Ambulatory Visit | Attending: Internal Medicine | Admitting: Internal Medicine

## 2023-08-21 DIAGNOSIS — E7800 Pure hypercholesterolemia, unspecified: Secondary | ICD-10-CM | POA: Insufficient documentation

## 2023-09-03 DIAGNOSIS — L218 Other seborrheic dermatitis: Secondary | ICD-10-CM | POA: Diagnosis not present

## 2023-09-03 DIAGNOSIS — D225 Melanocytic nevi of trunk: Secondary | ICD-10-CM | POA: Diagnosis not present

## 2023-09-03 DIAGNOSIS — D2261 Melanocytic nevi of right upper limb, including shoulder: Secondary | ICD-10-CM | POA: Diagnosis not present

## 2023-09-03 DIAGNOSIS — L814 Other melanin hyperpigmentation: Secondary | ICD-10-CM | POA: Diagnosis not present

## 2023-09-03 DIAGNOSIS — L570 Actinic keratosis: Secondary | ICD-10-CM | POA: Diagnosis not present

## 2023-12-01 ENCOUNTER — Inpatient Hospital Stay: Payer: Medicare Other | Attending: Oncology | Admitting: Oncology

## 2023-12-01 ENCOUNTER — Other Ambulatory Visit: Payer: Medicare Other

## 2023-12-01 ENCOUNTER — Inpatient Hospital Stay: Payer: Self-pay

## 2023-12-01 VITALS — BP 126/63 | HR 75 | Temp 97.8°F | Resp 18 | Ht 61.0 in | Wt 110.8 lb

## 2023-12-01 DIAGNOSIS — C9110 Chronic lymphocytic leukemia of B-cell type not having achieved remission: Secondary | ICD-10-CM | POA: Insufficient documentation

## 2023-12-01 LAB — CBC WITH DIFFERENTIAL (CANCER CENTER ONLY)
Abs Immature Granulocytes: 0.03 10*3/uL (ref 0.00–0.07)
Basophils Absolute: 0.1 10*3/uL (ref 0.0–0.1)
Basophils Relative: 0 %
Eosinophils Absolute: 0 10*3/uL (ref 0.0–0.5)
Eosinophils Relative: 0 %
HCT: 41.8 % (ref 36.0–46.0)
Hemoglobin: 13.5 g/dL (ref 12.0–15.0)
Immature Granulocytes: 0 %
Lymphocytes Relative: 87 %
Lymphs Abs: 27.7 10*3/uL — ABNORMAL HIGH (ref 0.7–4.0)
MCH: 29.5 pg (ref 26.0–34.0)
MCHC: 32.3 g/dL (ref 30.0–36.0)
MCV: 91.3 fL (ref 80.0–100.0)
Monocytes Absolute: 0.2 10*3/uL (ref 0.1–1.0)
Monocytes Relative: 1 %
Neutro Abs: 3.9 10*3/uL (ref 1.7–7.7)
Neutrophils Relative %: 12 %
Platelet Count: 258 10*3/uL (ref 150–400)
RBC: 4.58 MIL/uL (ref 3.87–5.11)
RDW: 13 % (ref 11.5–15.5)
WBC Count: 31.9 10*3/uL — ABNORMAL HIGH (ref 4.0–10.5)
nRBC: 0 % (ref 0.0–0.2)

## 2023-12-01 NOTE — Progress Notes (Signed)
   [facility]   Diagnosis: CLL  INTERVAL HISTORY:   Ms. Zimny returns as scheduled.  She feels well.  Good appetite.  No fever, night sweats, or recent infection.  She reports vertigo symptoms when raising her head for the past month.  The vertigo resolves in seconds.  She has ringing in the right ear when she shakes her head.  No ear pain.  Objective:  Vital signs in last 24 hours:  Blood pressure 126/63, pulse 75, temperature 97.8 F (36.6 C), temperature source Temporal, resp. rate 18, height 5' 1 (1.549 m), weight 110 lb 12.8 oz (50.3 kg), SpO2 100%.    HEENT: Flaking of the mucosa in the external canal bilaterally, perforated left tympanic membrane?  The right tympanic membrane appears clear. Lymphatics: No cervical, supraclavicular, axillary, or inguinal nodes Resp: Lungs clear bilaterally Cardio: Regular rate and rhythm GI: No hepatosplenomegaly Vascular: No leg edema  Lab Results:  Lab Results  Component Value Date   WBC 31.9 (H) 12/01/2023   HGB 13.5 12/01/2023   HCT 41.8 12/01/2023   MCV 91.3 12/01/2023   PLT 258 12/01/2023   NEUTROABS 3.9 12/01/2023    CMP  Lab Results  Component Value Date   NA 139 01/01/2022   K 5.1 01/01/2022   CL 106 01/01/2022   CO2 29 01/01/2022   GLUCOSE 100 (H) 01/01/2022   BUN 16 01/01/2022   CREATININE 0.70 01/01/2022   CALCIUM 10.3 01/01/2022   PROT 6.7 01/01/2022   ALBUMIN 4.3 01/01/2022   AST 17 01/01/2022   ALT 12 01/01/2022   ALKPHOS 53 01/01/2022   BILITOT 1.0 01/01/2022   GFRNONAA >60 01/01/2022   GFRAA >60 01/05/2020   Medications: I have reviewed the patient's current medications.   Assessment/Plan: Stage 0 CLL presented with lymphocytosis in 2016.  Flow cytometry 03/15/2015 consistent with chronic lymphocytic leukemia Osteoporosis Hypertension      Disposition: Ms.Kren has early stage CLL.  She is asymptomatic from the CLL and stable from a hematologic  standpoint.  There is no indication for treating the CLL.  She will seek medical attention for symptoms of an infection.  She will remain up-to-date on influenza and pneumonia vaccines.  I recommended she obtain a pneumococcal 20 or 21 vaccine if she has not received these.  She most likely has benign positional vertigo.  She will follow-up with Dr. Teofilo Fellers if the vertigo symptoms persist. She would like to continue follow-up in the hematology clinic.  She will return for an office visit in 1 year.  Coni Deep, MD  12/01/2023  12:22 PM

## 2024-03-03 DIAGNOSIS — M19041 Primary osteoarthritis, right hand: Secondary | ICD-10-CM | POA: Diagnosis not present

## 2024-03-03 DIAGNOSIS — M19042 Primary osteoarthritis, left hand: Secondary | ICD-10-CM | POA: Diagnosis not present

## 2024-03-03 DIAGNOSIS — M810 Age-related osteoporosis without current pathological fracture: Secondary | ICD-10-CM | POA: Diagnosis not present

## 2024-03-03 DIAGNOSIS — I10 Essential (primary) hypertension: Secondary | ICD-10-CM | POA: Diagnosis not present

## 2024-04-15 ENCOUNTER — Other Ambulatory Visit (HOSPITAL_BASED_OUTPATIENT_CLINIC_OR_DEPARTMENT_OTHER): Payer: Self-pay

## 2024-04-15 DIAGNOSIS — Z23 Encounter for immunization: Secondary | ICD-10-CM | POA: Diagnosis not present

## 2024-04-15 MED ORDER — FLUZONE HIGH-DOSE 0.5 ML IM SUSY
0.5000 mL | PREFILLED_SYRINGE | Freq: Once | INTRAMUSCULAR | 0 refills | Status: AC
  Filled 2024-04-15: qty 0.5, 1d supply, fill #0

## 2024-12-01 ENCOUNTER — Other Ambulatory Visit

## 2024-12-01 ENCOUNTER — Ambulatory Visit: Admitting: Oncology
